# Patient Record
Sex: Female | Born: 1990 | Race: Black or African American | Hispanic: Yes | Marital: Married | Smoking: Never smoker
Health system: Southern US, Community
[De-identification: ages and names within clinical notes are randomized; demographics above are authoritative.]

## PROBLEM LIST (undated history)

## (undated) ENCOUNTER — Inpatient Hospital Stay (HOSPITAL_COMMUNITY): Payer: Self-pay

## (undated) DIAGNOSIS — Z789 Other specified health status: Secondary | ICD-10-CM

## (undated) DIAGNOSIS — R51 Headache: Secondary | ICD-10-CM

## (undated) DIAGNOSIS — R519 Headache, unspecified: Secondary | ICD-10-CM

## (undated) DIAGNOSIS — G8929 Other chronic pain: Secondary | ICD-10-CM

## (undated) DIAGNOSIS — G4700 Insomnia, unspecified: Secondary | ICD-10-CM

## (undated) DIAGNOSIS — J3089 Other allergic rhinitis: Secondary | ICD-10-CM

## (undated) HISTORY — DX: Other allergic rhinitis: J30.89

## (undated) HISTORY — DX: Insomnia, unspecified: G47.00

## (undated) HISTORY — PX: NO PAST SURGERIES: SHX2092

## (undated) HISTORY — DX: Headache, unspecified: R51.9

## (undated) HISTORY — DX: Headache: R51

## (undated) HISTORY — DX: Other chronic pain: G89.29

---

## 2016-11-28 LAB — OB RESULTS CONSOLE HGB/HCT, BLOOD
HCT: 38
HEMOGLOBIN: 12.2

## 2016-11-28 LAB — OB RESULTS CONSOLE PLATELET COUNT: Platelets: 183

## 2017-02-27 ENCOUNTER — Encounter: Payer: Self-pay | Admitting: Advanced Practice Midwife

## 2017-02-27 ENCOUNTER — Ambulatory Visit (INDEPENDENT_AMBULATORY_CARE_PROVIDER_SITE_OTHER): Payer: Medicaid Other | Admitting: Advanced Practice Midwife

## 2017-02-27 ENCOUNTER — Other Ambulatory Visit (HOSPITAL_COMMUNITY)
Admission: RE | Admit: 2017-02-27 | Discharge: 2017-02-27 | Disposition: A | Discharge status: Home / Self Care | Payer: Medicaid Other | Source: Ambulatory Visit | Attending: Advanced Practice Midwife | Admitting: Advanced Practice Midwife

## 2017-02-27 VITALS — BP 105/64 | HR 82 | Ht 69.0 in | Wt 160.0 lb

## 2017-02-27 DIAGNOSIS — N898 Other specified noninflammatory disorders of vagina: Secondary | ICD-10-CM | POA: Diagnosis not present

## 2017-02-27 DIAGNOSIS — Z3689 Encounter for other specified antenatal screening: Secondary | ICD-10-CM | POA: Diagnosis not present

## 2017-02-27 DIAGNOSIS — B9689 Other specified bacterial agents as the cause of diseases classified elsewhere: Secondary | ICD-10-CM | POA: Insufficient documentation

## 2017-02-27 DIAGNOSIS — N760 Acute vaginitis: Secondary | ICD-10-CM | POA: Diagnosis not present

## 2017-02-27 DIAGNOSIS — Z3483 Encounter for supervision of other normal pregnancy, third trimester: Secondary | ICD-10-CM

## 2017-02-27 DIAGNOSIS — Z113 Encounter for screening for infections with a predominantly sexual mode of transmission: Secondary | ICD-10-CM

## 2017-02-27 DIAGNOSIS — Z3480 Encounter for supervision of other normal pregnancy, unspecified trimester: Secondary | ICD-10-CM | POA: Insufficient documentation

## 2017-02-27 LAB — POCT URINALYSIS DIP (DEVICE)
Bilirubin Urine: NEGATIVE
GLUCOSE, UA: NEGATIVE mg/dL
Hgb urine dipstick: NEGATIVE
Ketones, ur: NEGATIVE mg/dL
Nitrite: NEGATIVE
PROTEIN: NEGATIVE mg/dL
Specific Gravity, Urine: 1.01 (ref 1.005–1.030)
UROBILINOGEN UA: 0.2 mg/dL (ref 0.0–1.0)
pH: 7 (ref 5.0–8.0)

## 2017-02-27 NOTE — Progress Notes (Signed)
PMH form completed by the patient.

## 2017-02-27 NOTE — Patient Instructions (Signed)
Third Trimester of Pregnancy The third trimester is from week 28 through week 40 (months 7 through 9). The third trimester is a time when the unborn baby (fetus) is growing rapidly. At the end of the ninth month, the fetus is about 20 inches in length and weighs 6-10 pounds. Body changes during your third trimester Your body will continue to go through many changes during pregnancy. The changes vary from woman to woman. During the third trimester:  Your weight will continue to increase. You can expect to gain 25-35 pounds (11-16 kg) by the end of the pregnancy.  You may begin to get stretch marks on your hips, abdomen, and breasts.  You may urinate more often because the fetus is moving lower into your pelvis and pressing on your bladder.  You may develop or continue to have heartburn. This is caused by increased hormones that slow down muscles in the digestive tract.  You may develop or continue to have constipation because increased hormones slow digestion and cause the muscles that push waste through your intestines to relax.  You may develop hemorrhoids. These are swollen veins (varicose veins) in the rectum that can itch or be painful.  You may develop swollen, bulging veins (varicose veins) in your legs.  You may have increased body aches in the pelvis, back, or thighs. This is due to weight gain and increased hormones that are relaxing your joints.  You may have changes in your hair. These can include thickening of your hair, rapid growth, and changes in texture. Some women also have hair loss during or after pregnancy, or hair that feels dry or thin. Your hair will most likely return to normal after your baby is born.  Your breasts will continue to grow and they will continue to become tender. A yellow fluid (colostrum) may leak from your breasts. This is the first milk you are producing for your baby.  Your belly button may stick out.  You may notice more swelling in your hands,  face, or ankles.  You may have increased tingling or numbness in your hands, arms, and legs. The skin on your belly may also feel numb.  You may feel short of breath because of your expanding uterus.  You may have more problems sleeping. This can be caused by the size of your belly, increased need to urinate, and an increase in your body's metabolism.  You may notice the fetus "dropping," or moving lower in your abdomen (lightening).  You may have increased vaginal discharge.  You may notice your joints feel loose and you may have pain around your pelvic bone.  What to expect at prenatal visits You will have prenatal exams every 2 weeks until week 36. Then you will have weekly prenatal exams. During a routine prenatal visit:  You will be weighed to make sure you and the baby are growing normally.  Your blood pressure will be taken.  Your abdomen will be measured to track your baby's growth.  The fetal heartbeat will be listened to.  Any test results from the previous visit will be discussed.  You may have a cervical check near your due date to see if your cervix has softened or thinned (effaced).  You will be tested for Group B streptococcus. This happens between 35 and 37 weeks.  Your health care provider may ask you:  What your birth plan is.  How you are feeling.  If you are feeling the baby move.  If you have had   any abnormal symptoms, such as leaking fluid, bleeding, severe headaches, or abdominal cramping.  If you are using any tobacco products, including cigarettes, chewing tobacco, and electronic cigarettes.  If you have any questions.  Other tests or screenings that may be performed during your third trimester include:  Blood tests that check for low iron levels (anemia).  Fetal testing to check the health, activity level, and growth of the fetus. Testing is done if you have certain medical conditions or if there are problems during the  pregnancy.  Nonstress test (NST). This test checks the health of your baby to make sure there are no signs of problems, such as the baby not getting enough oxygen. During this test, a belt is placed around your belly. The baby is made to move, and its heart rate is monitored during movement.  What is false labor? False labor is a condition in which you feel small, irregular tightenings of the muscles in the womb (contractions) that usually go away with rest, changing position, or drinking water. These are called Braxton Hicks contractions. Contractions may last for hours, days, or even weeks before true labor sets in. If contractions come at regular intervals, become more frequent, increase in intensity, or become painful, you should see your health care provider. What are the signs of labor?  Abdominal cramps.  Regular contractions that start at 10 minutes apart and become stronger and more frequent with time.  Contractions that start on the top of the uterus and spread down to the lower abdomen and back.  Increased pelvic pressure and dull back pain.  A watery or bloody mucus discharge that comes from the vagina.  Leaking of amniotic fluid. This is also known as your "water breaking." It could be a slow trickle or a gush. Let your health care provider know if it has a color or strange odor. If you have any of these signs, call your health care provider right away, even if it is before your due date. Follow these instructions at home: Medicines  Follow your health care provider's instructions regarding medicine use. Specific medicines may be either safe or unsafe to take during pregnancy.  Take a prenatal vitamin that contains at least 600 micrograms (mcg) of folic acid.  If you develop constipation, try taking a stool softener if your health care provider approves. Eating and drinking  Eat a balanced diet that includes fresh fruits and vegetables, whole grains, good sources of protein  such as meat, eggs, or tofu, and low-fat dairy. Your health care provider will help you determine the amount of weight gain that is right for you.  Avoid raw meat and uncooked cheese. These carry germs that can cause birth defects in the baby.  If you have low calcium intake from food, talk to your health care provider about whether you should take a daily calcium supplement.  Eat four or five small meals rather than three large meals a day.  Limit foods that are high in fat and processed sugars, such as fried and sweet foods.  To prevent constipation: ? Drink enough fluid to keep your urine clear or pale yellow. ? Eat foods that are high in fiber, such as fresh fruits and vegetables, whole grains, and beans. Activity  Exercise only as directed by your health care provider. Most women can continue their usual exercise routine during pregnancy. Try to exercise for 30 minutes at least 5 days a week. Stop exercising if you experience uterine contractions.  Avoid heavy   lifting.  Do not exercise in extreme heat or humidity, or at high altitudes.  Wear low-heel, comfortable shoes.  Practice good posture.  You may continue to have sex unless your health care provider tells you otherwise. Relieving pain and discomfort  Take frequent breaks and rest with your legs elevated if you have leg cramps or low back pain.  Take warm sitz baths to soothe any pain or discomfort caused by hemorrhoids. Use hemorrhoid cream if your health care provider approves.  Wear a good support bra to prevent discomfort from breast tenderness.  If you develop varicose veins: ? Wear support pantyhose or compression stockings as told by your healthcare provider. ? Elevate your feet for 15 minutes, 3-4 times a day. Prenatal care  Write down your questions. Take them to your prenatal visits.  Keep all your prenatal visits as told by your health care provider. This is important. Safety  Wear your seat belt at  all times when driving.  Make a list of emergency phone numbers, including numbers for family, friends, the hospital, and police and fire departments. General instructions  Avoid cat litter boxes and soil used by cats. These carry germs that can cause birth defects in the baby. If you have a cat, ask someone to clean the litter box for you.  Do not travel far distances unless it is absolutely necessary and only with the approval of your health care provider.  Do not use hot tubs, steam rooms, or saunas.  Do not drink alcohol.  Do not use any products that contain nicotine or tobacco, such as cigarettes and e-cigarettes. If you need help quitting, ask your health care provider.  Do not use any medicinal herbs or unprescribed drugs. These chemicals affect the formation and growth of the baby.  Do not douche or use tampons or scented sanitary pads.  Do not cross your legs for long periods of time.  To prepare for the arrival of your baby: ? Take prenatal classes to understand, practice, and ask questions about labor and delivery. ? Make a trial run to the hospital. ? Visit the hospital and tour the maternity area. ? Arrange for maternity or paternity leave through employers. ? Arrange for family and friends to take care of pets while you are in the hospital. ? Purchase a rear-facing car seat and make sure you know how to install it in your car. ? Pack your hospital bag. ? Prepare the baby's nursery. Make sure to remove all pillows and stuffed animals from the baby's crib to prevent suffocation.  Visit your dentist if you have not gone during your pregnancy. Use a soft toothbrush to brush your teeth and be gentle when you floss. Contact a health care provider if:  You are unsure if you are in labor or if your water has broken.  You become dizzy.  You have mild pelvic cramps, pelvic pressure, or nagging pain in your abdominal area.  You have lower back pain.  You have persistent  nausea, vomiting, or diarrhea.  You have an unusual or bad smelling vaginal discharge.  You have pain when you urinate. Get help right away if:  Your water breaks before 37 weeks.  You have regular contractions less than 5 minutes apart before 37 weeks.  You have a fever.  You are leaking fluid from your vagina.  You have spotting or bleeding from your vagina.  You have severe abdominal pain or cramping.  You have rapid weight loss or weight gain.    You have shortness of breath with chest pain.  You notice sudden or extreme swelling of your face, hands, ankles, feet, or legs.  Your baby makes fewer than 10 movements in 2 hours.  You have severe headaches that do not go away when you take medicine.  You have vision changes. Summary  The third trimester is from week 28 through week 40, months 7 through 9. The third trimester is a time when the unborn baby (fetus) is growing rapidly.  During the third trimester, your discomfort may increase as you and your baby continue to gain weight. You may have abdominal, leg, and back pain, sleeping problems, and an increased need to urinate.  During the third trimester your breasts will keep growing and they will continue to become tender. A yellow fluid (colostrum) may leak from your breasts. This is the first milk you are producing for your baby.  False labor is a condition in which you feel small, irregular tightenings of the muscles in the womb (contractions) that eventually go away. These are called Braxton Hicks contractions. Contractions may last for hours, days, or even weeks before true labor sets in.  Signs of labor can include: abdominal cramps; regular contractions that start at 10 minutes apart and become stronger and more frequent with time; watery or bloody mucus discharge that comes from the vagina; increased pelvic pressure and dull back pain; and leaking of amniotic fluid. This information is not intended to replace advice  given to you by your health care provider. Make sure you discuss any questions you have with your health care provider. Document Released: 05/10/2001 Document Revised: 10/22/2015 Document Reviewed: 07/17/2012 Elsevier Interactive Patient Education  2017 Elsevier Inc.  

## 2017-02-27 NOTE — Progress Notes (Signed)
  Subjective:    Anna [name] is being seen today for her first obstetrical visit.  This is a planned pregnancy. She is at [redacted]w[redacted]d gestation. Her obstetrical history is significant for none. Relationship with FOB: significant other, living together. Patient does intend to breast feed. Pregnancy history fully reviewed.  Patient reports vaginal irritation and Patient reports yeast infection sx, and used OTC treatment. She would like to make sure it has resloved. .  Review of Systems:   Review of Systems  Constitutional: Negative for chills and fever.  Gastrointestinal: Negative for abdominal pain.  Genitourinary: Positive for vaginal discharge. Negative for dysuria, frequency and vaginal bleeding.    Objective:     BP 105/64   Pulse 82   Ht  (1.753 m)   Wt 160 lb (72.6 kg)   LMP 08/18/2016 (Exact Date)   BMI 23.63 kg/m  Physical Exam  Nursing note and vitals reviewed. Constitutional: She is oriented to person, place, and time. She appears well-developed and well-nourished. No distress.  HENT:  Head: Normocephalic.  Cardiovascular: Normal rate.   Respiratory: Effort normal.  GI: Soft. There is no tenderness. There is no rebound.  Neurological: She is alert and oriented to person, place, and time.  Skin: Skin is warm and dry.  Psychiatric: She has a normal mood and affect.    Exam   FH 28cm FHT 120BPM Assessment:    Pregnancy: [id number] There are no active problems to display for this patient.      Plan:     Initial labs drawn. Prenatal vitamins. Problem list reviewed and updated. AFP3 discussed: too late at this time. Will review records to see if it was done. . Role of ultrasound in pregnancy discussed; fetal survey: Will review records and order if needed . Amniocentesis discussed: not indicated. Follow up in 2 weeks. 50% of 40 min visit spent on counseling and coordination of care.     Thressa Sheller 02/27/2017

## 2017-02-27 NOTE — Addendum Note (Signed)
Addended by: Lorelle Gibbs L on: 02/27/2017 02:22 PM   Modules accepted: Orders

## 2017-02-28 LAB — CERVICOVAGINAL ANCILLARY ONLY
BACTERIAL VAGINITIS: POSITIVE — AB
Candida vaginitis: NEGATIVE
Chlamydia: NEGATIVE
Neisseria Gonorrhea: NEGATIVE
TRICH (WINDOWPATH): NEGATIVE

## 2017-03-02 ENCOUNTER — Ambulatory Visit (HOSPITAL_COMMUNITY)
Admission: RE | Admit: 2017-03-02 | Discharge: 2017-03-02 | Disposition: A | Discharge status: Home / Self Care | Payer: Medicaid Other | Source: Ambulatory Visit | Attending: Advanced Practice Midwife | Admitting: Advanced Practice Midwife

## 2017-03-02 ENCOUNTER — Other Ambulatory Visit: Payer: Self-pay | Admitting: Advanced Practice Midwife

## 2017-03-02 DIAGNOSIS — Z3689 Encounter for other specified antenatal screening: Secondary | ICD-10-CM

## 2017-03-02 DIAGNOSIS — O0932 Supervision of pregnancy with insufficient antenatal care, second trimester: Secondary | ICD-10-CM

## 2017-03-02 DIAGNOSIS — Z3A25 25 weeks gestation of pregnancy: Secondary | ICD-10-CM

## 2017-03-02 DIAGNOSIS — O402XX0 Polyhydramnios, second trimester, not applicable or unspecified: Secondary | ICD-10-CM

## 2017-03-02 DIAGNOSIS — Z3492 Encounter for supervision of normal pregnancy, unspecified, second trimester: Secondary | ICD-10-CM

## 2017-03-02 DIAGNOSIS — Z3483 Encounter for supervision of other normal pregnancy, third trimester: Secondary | ICD-10-CM

## 2017-03-02 DIAGNOSIS — Z3687 Encounter for antenatal screening for uncertain dates: Secondary | ICD-10-CM | POA: Insufficient documentation

## 2017-03-03 ENCOUNTER — Encounter: Payer: Self-pay | Admitting: *Deleted

## 2017-03-06 ENCOUNTER — Other Ambulatory Visit: Payer: Medicaid Other

## 2017-03-06 DIAGNOSIS — Z3493 Encounter for supervision of normal pregnancy, unspecified, third trimester: Secondary | ICD-10-CM

## 2017-03-07 ENCOUNTER — Other Ambulatory Visit: Payer: Self-pay | Admitting: General Practice

## 2017-03-07 LAB — OBSTETRIC PANEL, INCLUDING HIV
ANTIBODY SCREEN: NEGATIVE
BASOS: 0 %
Basophils Absolute: 0 10*3/uL (ref 0.0–0.2)
EOS (ABSOLUTE): 0.2 10*3/uL (ref 0.0–0.4)
Eos: 2 %
HEMATOCRIT: 35.2 % (ref 34.0–46.6)
HEMOGLOBIN: 11.5 g/dL (ref 11.1–15.9)
HEP B S AG: NEGATIVE
HIV SCREEN 4TH GENERATION: NONREACTIVE
IMMATURE GRANS (ABS): 0 10*3/uL (ref 0.0–0.1)
Immature Granulocytes: 0 %
LYMPHS: 27 %
Lymphocytes Absolute: 1.9 10*3/uL (ref 0.7–3.1)
MCH: 31.5 pg (ref 26.6–33.0)
MCHC: 32.7 g/dL (ref 31.5–35.7)
MCV: 96 fL (ref 79–97)
MONOCYTES: 6 %
Monocytes Absolute: 0.4 10*3/uL (ref 0.1–0.9)
NEUTROS ABS: 4.7 10*3/uL (ref 1.4–7.0)
Neutrophils: 65 %
Platelets: 193 10*3/uL (ref 150–379)
RBC: 3.65 x10E6/uL — ABNORMAL LOW (ref 3.77–5.28)
RDW: 14.4 % (ref 12.3–15.4)
RPR: NONREACTIVE
RUBELLA: 1.3 {index} (ref >0.99)
Rh Factor: POSITIVE
WBC: 7.2 10*3/uL (ref 3.4–10.8)

## 2017-03-07 LAB — GLUCOSE TOLERANCE, 2 HOURS W/ 1HR
GLUCOSE, 1 HOUR: 116 mg/dL (ref 65–179)
GLUCOSE, 2 HOUR: 90 mg/dL (ref 65–152)
Glucose, Fasting: 72 mg/dL (ref 65–91)

## 2017-03-07 MED ORDER — METRONIDAZOLE 500 MG PO TABS: 500.0000 mg | ORAL_TABLET | Freq: Two times a day (BID) | ORAL | 0 refills | Status: DC

## 2017-03-07 NOTE — Telephone Encounter (Signed)
Patient has been notified of lab results and a prescription for Flagyl 500 mg BID x7 days has been sent to [facility] per patient preference, patient verbalized understanding

## 2017-03-09 DIAGNOSIS — Z3480 Encounter for supervision of other normal pregnancy, unspecified trimester: Secondary | ICD-10-CM

## 2017-03-13 ENCOUNTER — Ambulatory Visit (INDEPENDENT_AMBULATORY_CARE_PROVIDER_SITE_OTHER): Payer: Medicaid Other | Admitting: Advanced Practice Midwife

## 2017-03-13 DIAGNOSIS — Z3480 Encounter for supervision of other normal pregnancy, unspecified trimester: Secondary | ICD-10-CM

## 2017-03-13 NOTE — Patient Instructions (Signed)
AREA PEDIATRIC/FAMILY PRACTICE PHYSICIANS  [facility] [address], [address] [place], [place]  [address] Phone - [phone]   Fax - [phone]  [facility] [address] [place], [place] [address] Phone - [phone]   Fax - [phone]  [facility] [address] [place], [place]  [address] Phone - [phone]   Fax - [phone]  [facility] [address] [place], [place]  [address] Phone - [phone]   Fax - [phone]  [facility] [address] [place], [place]  [address] Phone - [phone]   Fax - [phone]  [facility] [address] [place], [place]  [address] Phone - [phone]   Fax - [phone]  [facility] [address] [place], [place]  [address] Phone - [phone]   Fax - [phone]  [facility] [address] [place], [place]  [address] Phone - [phone]   Fax - [phone]  [facility] [address] [place], [place]  [address] Phone - [phone]   Fax - [phone] [facility] [address] [place], [place]  [address] Phone - [phone]   Fax - [phone]  [facility] [address] [place], [place]  [address] Phone - [phone]   Fax - [phone]  [facility] [address] [place], [place]  [address] Phone - [phone]   Fax - [phone]  [facility] [address] [place], [place]  [address] Phone - [phone]   Fax - [phone]  [facility] [address] [place], [place]  [address] Phone - [phone]  [facility] [address] [employer] [place], [place]  [address] Phone - [phone]   Fax - [phone]  [facility] [address] [place], [place]  [address] Phone - [phone]   Fax - [phone]  [facility] [address] [place], [place]  [address] Phone - [phone]   Fax - [phone]  [facility] [address] [place], [place]  [address] Phone - [phone]   Fax - [phone]   [facility] [address] [place], [place]  [address] Phone - [phone]   Fax - [phone]  [facility] [address] [place], [place]  [address] Phone - [phone]   Fax - [phone]  [facility] [address] [place], [place]  [address] Phone - [phone]   Fax - [phone]  [facility] [address] [place], [place]  [address] Phone - [phone]   Fax - [phone]  [facility] [address] , [place]  [address] Phone - [phone]   Fax - [phone] [facility] [address] [place], [place]  [address] Phone - [phone]   Fax - [phone]  [facility] [address] [place], [place]  [address] Phone - [phone]   Fax - [phone]  [facility] [address] [place], [place]  [address] Phone - [phone]   Fax - [phone]  [facility] Charlene Flemming MD [address] [place]  [address] Phone [phone]  Fax [phone]   

## 2017-03-13 NOTE — Progress Notes (Signed)
   PRENATAL VISIT NOTE  Subjective:  Anna [name] is a 26 y.o. [phone] at [redacted]w[redacted]d being seen today for ongoing prenatal care.  She is currently monitored for the following issues for this low-risk pregnancy and has Supervision of other normal pregnancy, antepartum on her problem list.  Patient reports no complaints.  Contractions: Irritability. Vag. Bleeding: None.  Movement: Present. Denies leaking of fluid.   The following portions of the patient's history were reviewed and updated as appropriate: allergies, current medications, past family history, past medical history, past social history, past surgical history and problem list. Problem list updated.  Objective:   Vitals:   03/13/17 1254  BP: 108/60  Pulse: 88  Weight: 166 lb 9.6 oz (75.6 kg)    Fetal Status: Fetal Heart Rate (bpm): 144   Movement: Present     General:  Alert, oriented and cooperative. Patient is in no acute distress.  Skin: Skin is warm and dry. No rash noted.   Cardiovascular: Normal heart rate noted  Respiratory: Normal respiratory effort, no problems with respiration noted  Abdomen: Soft, gravid, appropriate for gestational age.  Pain/Pressure: Present     Pelvic: Cervical exam deferred        Extremities: Normal range of motion.  Edema: None  Mental Status:  Normal mood and affect. Normal behavior. Normal judgment and thought content.   Assessment and Plan:  Pregnancy: [id number] at [redacted]w[redacted]d  1. Supervision of other normal pregnancy, antepartum - Routine care -FU [place] per MFM in 4 weeks   Preterm labor symptoms and general obstetric precautions including but not limited to vaginal bleeding, contractions, leaking of fluid and fetal movement were reviewed in detail with the patient. Please refer to After Visit Summary for other counseling recommendations.  Return in about 2 weeks (around 03/27/2017).   Thressa Sheller, CNM

## 2017-03-17 ENCOUNTER — Encounter: Payer: Self-pay | Admitting: *Deleted

## 2017-03-27 ENCOUNTER — Ambulatory Visit (INDEPENDENT_AMBULATORY_CARE_PROVIDER_SITE_OTHER): Payer: Medicaid Other | Admitting: Advanced Practice Midwife

## 2017-03-27 VITALS — BP 102/65 | HR 84 | Wt 167.8 lb

## 2017-03-27 DIAGNOSIS — Z3483 Encounter for supervision of other normal pregnancy, third trimester: Secondary | ICD-10-CM | POA: Diagnosis not present

## 2017-03-27 DIAGNOSIS — Z3480 Encounter for supervision of other normal pregnancy, unspecified trimester: Secondary | ICD-10-CM

## 2017-03-27 DIAGNOSIS — O403XX0 Polyhydramnios, third trimester, not applicable or unspecified: Secondary | ICD-10-CM

## 2017-03-27 DIAGNOSIS — Z23 Encounter for immunization: Secondary | ICD-10-CM | POA: Diagnosis not present

## 2017-03-27 NOTE — Patient Instructions (Signed)
AREA PEDIATRIC/FAMILY PRACTICE PHYSICIANS  [facility] [address] [place], [place]  [address] Phone - [phone]   Fax - [phone]  [facility] [address] [place], [place] [address] Phone - [phone]   Fax - [phone]  [facility] [address] [place], [place]  [address] Phone - [phone]   Fax - [phone]  [facility] [address] [place], [place]  [address] Phone - [phone]   Fax - [phone]  [facility] [address] [place], [place]  [address] Phone - [phone]   Fax - [phone]  [facility] [address] [place], [place]  [address] Phone - [phone]   Fax - [phone]  [facility] [place] [address] [place], [place]  [address] Phone - [phone]   Fax - [phone]  [facility] [address] [place], [place]  [address] Phone - [phone]   Fax - [phone]  [facility] [address] [place], [place]  [address] Phone - [phone]   Fax - [phone] [facility] [address] [place], [place]  [address] Phone - [phone]   Fax - [phone]  [facility] [address] [place], [place]  [address] Phone - [phone]   Fax - [phone]  [facility] [address] [place], [place]  [address] Phone - [phone]   Fax - [phone]  [facility] [address] [place], [place]  [address] Phone - [phone]   Fax - [phone]  [facility] [address] [place], [place]  [address] Phone - [phone]  [facility] [address] [place], [place]  [address] Phone - [phone]   Fax - [phone]  [facility] [address] [place], [place]  [address] Phone - [phone]   Fax - [phone]  [facility] [address] [place], [place]  [address] Phone - [phone]   Fax - [phone]  [facility] [address] [place], [place]  [address] Phone - [phone]   Fax - [phone]   [facility] [address] [place], [place]  [address] Phone - [phone]   Fax - [phone]  [facility] [address] [place], [place]  [address] Phone - [phone]   Fax - [phone]  [facility] [address] , [place]  [address] Phone - [phone]   Fax - [phone]  [facility] [address] , [place]  [address] Phone - [phone]   Fax - [phone]  [facility] [address] , [place]  [address] Phone - [phone]   Fax - [phone] [facility] [address] [place], [place]  [address] Phone - [phone]   Fax - [phone]  [facility] [address] [place], [place]  [address] Phone - [phone]   Fax - [phone]   [facility] [address] [place], [place]  [address] Phone - [phone]   Fax - [phone]  [facility] Charlene Flemming MD [address] [place]  [address] Phone [phone]  Fax [phone]   

## 2017-03-27 NOTE — Progress Notes (Signed)
   PRENATAL VISIT NOTE   Subjective:  Anna [name] is a 26 y.o. [phone] at 6960w0d being seen today for ongoing prenatal care.  She is currently monitored for the following issues for this low-risk pregnancy and has Supervision of other normal pregnancy, antepartum and Polyhydramnios affecting pregnancy in third trimester on her problem list.  Patient reports no complaints.  Contractions: Irritability. Vag. Bleeding: None.  Movement: Present. Denies leaking of fluid.   The following portions of the patient's history were reviewed and updated as appropriate: allergies, current medications, past family history, past medical history, past social history, past surgical history and problem list. Problem list updated.  Objective:   Vitals:   03/27/17 1005  BP: 102/65  Pulse: 84  Weight: 167 lb 12.8 oz (76.1 kg)    Fetal Status: Fetal Heart Rate (bpm): 146   Movement: Present     FH 30 cm  General:  Alert, oriented and cooperative. Patient is in no acute distress.  Skin: Skin is warm and dry. No rash noted.   Cardiovascular: Normal heart rate noted  Respiratory: Normal respiratory effort, no problems with respiration noted  Abdomen: Soft, gravid, appropriate for gestational age.  Pain/Pressure: Present     Pelvic: Cervical exam deferred        Extremities: Normal range of motion.  Edema: None  Mental Status:  Normal mood and affect. Normal behavior. Normal judgment and thought content.   Assessment and Plan:  Pregnancy: [id number] at 4360w0d  1. Supervision of other normal pregnancy, antepartum - Routine care  - TDaP today  - Will call prior OB office for records as we have not received them.   2. Polyhydramnios affecting pregnancy in third trimester - FU US with MFM on 04/03/17   Preterm labor symptoms and general obstetric precautions including but not limited to vaginal bleeding, contractions, leaking of fluid and fetal movement were reviewed in detail with the patient. Please refer to  After Visit Summary for other counseling recommendations.  Return in about 2 weeks (around 04/10/2017).   Thressa ShellerHeather Kennis Wissmann, CNM

## 2017-03-27 NOTE — Progress Notes (Signed)
Pt stated lower abdominal having pain.

## 2017-03-28 ENCOUNTER — Encounter: Payer: Self-pay | Admitting: *Deleted

## 2017-04-03 ENCOUNTER — Ambulatory Visit (HOSPITAL_COMMUNITY)
Admission: RE | Admit: 2017-04-03 | Discharge: 2017-04-03 | Disposition: A | Discharge status: Home / Self Care | Payer: Medicaid Other | Source: Ambulatory Visit | Attending: Advanced Practice Midwife | Admitting: Advanced Practice Midwife

## 2017-04-03 ENCOUNTER — Encounter (HOSPITAL_COMMUNITY): Payer: Self-pay

## 2017-04-03 ENCOUNTER — Other Ambulatory Visit: Payer: Self-pay | Admitting: Advanced Practice Midwife

## 2017-04-03 DIAGNOSIS — O365930 Maternal care for other known or suspected poor fetal growth, third trimester, not applicable or unspecified: Secondary | ICD-10-CM | POA: Diagnosis not present

## 2017-04-03 DIAGNOSIS — Z3A30 30 weeks gestation of pregnancy: Secondary | ICD-10-CM | POA: Diagnosis not present

## 2017-04-03 DIAGNOSIS — O403XX0 Polyhydramnios, third trimester, not applicable or unspecified: Secondary | ICD-10-CM | POA: Diagnosis not present

## 2017-04-03 DIAGNOSIS — Z3480 Encounter for supervision of other normal pregnancy, unspecified trimester: Secondary | ICD-10-CM

## 2017-04-03 HISTORY — DX: Other specified health status: Z78.9

## 2017-04-03 NOTE — Addendum Note (Signed)
Encounter addended by: Lenise ArenaBazemore, Sumedh Shinsato, RDMS on: 04/03/2017 10:51 AM  Actions taken: Imaging Exam ended

## 2017-04-10 ENCOUNTER — Other Ambulatory Visit (HOSPITAL_COMMUNITY)
Admission: RE | Admit: 2017-04-10 | Discharge: 2017-04-10 | Disposition: A | Discharge status: Home / Self Care | Payer: Medicaid Other | Source: Ambulatory Visit | Attending: Advanced Practice Midwife | Admitting: Advanced Practice Midwife

## 2017-04-10 ENCOUNTER — Ambulatory Visit (INDEPENDENT_AMBULATORY_CARE_PROVIDER_SITE_OTHER): Payer: Medicaid Other | Admitting: Advanced Practice Midwife

## 2017-04-10 VITALS — BP 120/60 | HR 83 | Wt 173.0 lb

## 2017-04-10 DIAGNOSIS — O365930 Maternal care for other known or suspected poor fetal growth, third trimester, not applicable or unspecified: Secondary | ICD-10-CM

## 2017-04-10 DIAGNOSIS — Z3483 Encounter for supervision of other normal pregnancy, third trimester: Secondary | ICD-10-CM

## 2017-04-10 DIAGNOSIS — Z113 Encounter for screening for infections with a predominantly sexual mode of transmission: Secondary | ICD-10-CM

## 2017-04-10 DIAGNOSIS — O365990 Maternal care for other known or suspected poor fetal growth, unspecified trimester, not applicable or unspecified: Secondary | ICD-10-CM | POA: Insufficient documentation

## 2017-04-10 DIAGNOSIS — Z3480 Encounter for supervision of other normal pregnancy, unspecified trimester: Secondary | ICD-10-CM | POA: Diagnosis present

## 2017-04-10 DIAGNOSIS — O365932 Maternal care for other known or suspected poor fetal growth, third trimester, fetus 2: Secondary | ICD-10-CM | POA: Insufficient documentation

## 2017-04-10 NOTE — Progress Notes (Signed)
   PRENATAL VISIT NOTE  Subjective:  Anna [name] is a 26 y.o. [id number] at 475w0d being seen today for ongoing prenatal care.  She is currently monitored for the following issues for this low-risk pregnancy and has Supervision of other normal pregnancy, antepartum; Polyhydramnios affecting pregnancy in third trimester; and IUGR (intrauterine growth restriction) affecting care of mother on their problem list.  Patient reports no complaints.  Contractions: Irregular. Vag. Bleeding: None.  Movement: Present. Denies leaking of fluid. Every couple of hours will have some contractions. Go away with water and rest. She denies any VB or LOF. Reports normal fetal movement. She denies intercourse in the last 24 hours.   The following portions of the patient's history were reviewed and updated as appropriate: allergies, current medications, past family history, past medical history, past social history, past surgical history and problem list. Problem list updated.  Objective:   Vitals:   04/10/17 1009  BP: 120/60  Pulse: 83  Weight: 173 lb (78.5 kg)    Fetal Status: Fetal Heart Rate (bpm): 143 Fundal Height: 33 cm Movement: Present     General:  Alert, oriented and cooperative. Patient is in no acute distress.  Skin: Skin is warm and dry. No rash noted.   Cardiovascular: Normal heart rate noted  Respiratory: Normal respiratory effort, no problems with respiration noted  Abdomen: Soft, gravid, appropriate for gestational age.  Pain/Pressure: Present     Pelvic: Cervical exam deferred Dilation: Closed Effacement (%): Thick Station: Ballotable  Extremities: Normal range of motion.  Edema: None  Mental Status:  Normal mood and affect. Normal behavior. Normal judgment and thought content.   Assessment and Plan:  Pregnancy: [id number] at 865w0d  1. Supervision of other normal pregnancy, antepartum - US MFM OB FOLLOW UP; Future - Wet prep. Gc/ct sent   2. Poor fetal growth affecting management of mother  in third trimester, single or unspecified fetus - US MFM OB FOLLOW UP; Future  Preterm labor symptoms and general obstetric precautions including but not limited to vaginal bleeding, contractions, leaking of fluid and fetal movement were reviewed in detail with the patient. Please refer to After Visit Summary for other counseling recommendations.  Return in about 2 weeks (around 04/24/2017).   Thressa ShellerHeather Jonasia Coiner, CNM

## 2017-04-10 NOTE — Patient Instructions (Signed)
Third Trimester of Pregnancy The third trimester is from week 28 through week 40 (months 7 through 9). The third trimester is a time when the unborn baby (fetus) is growing rapidly. At the end of the ninth month, the fetus is about 20 inches in length and weighs 6-10 pounds. Body changes during your third trimester Your body will continue to go through many changes during pregnancy. The changes vary from woman to woman. During the third trimester:  Your weight will continue to increase. You can expect to gain 25-35 pounds (11-16 kg) by the end of the pregnancy.  You may begin to get stretch marks on your hips, abdomen, and breasts.  You may urinate more often because the fetus is moving lower into your pelvis and pressing on your bladder.  You may develop or continue to have heartburn. This is caused by increased hormones that slow down muscles in the digestive tract.  You may develop or continue to have constipation because increased hormones slow digestion and cause the muscles that push waste through your intestines to relax.  You may develop hemorrhoids. These are swollen veins (varicose veins) in the rectum that can itch or be painful.  You may develop swollen, bulging veins (varicose veins) in your legs.  You may have increased body aches in the pelvis, back, or thighs. This is due to weight gain and increased hormones that are relaxing your joints.  You may have changes in your hair. These can include thickening of your hair, rapid growth, and changes in texture. Some women also have hair loss during or after pregnancy, or hair that feels dry or thin. Your hair will most likely return to normal after your baby is born.  Your breasts will continue to grow and they will continue to become tender. A yellow fluid (colostrum) may leak from your breasts. This is the first milk you are producing for your baby.  Your belly button may stick out.  You may notice more swelling in your hands,  face, or ankles.  You may have increased tingling or numbness in your hands, arms, and legs. The skin on your belly may also feel numb.  You may feel short of breath because of your expanding uterus.  You may have more problems sleeping. This can be caused by the size of your belly, increased need to urinate, and an increase in your body's metabolism.  You may notice the fetus "dropping," or moving lower in your abdomen (lightening).  You may have increased vaginal discharge.  You may notice your joints feel loose and you may have pain around your pelvic bone.  What to expect at prenatal visits You will have prenatal exams every 2 weeks until week 36. Then you will have weekly prenatal exams. During a routine prenatal visit:  You will be weighed to make sure you and the baby are growing normally.  Your blood pressure will be taken.  Your abdomen will be measured to track your baby's growth.  The fetal heartbeat will be listened to.  Any test results from the previous visit will be discussed.  You may have a cervical check near your due date to see if your cervix has softened or thinned (effaced).  You will be tested for Group B streptococcus. This happens between 35 and 37 weeks.  Your health care provider may ask you:  What your birth plan is.  How you are feeling.  If you are feeling the baby move.  If you have had   any abnormal symptoms, such as leaking fluid, bleeding, severe headaches, or abdominal cramping.  If you are using any tobacco products, including cigarettes, chewing tobacco, and electronic cigarettes.  If you have any questions.  Other tests or screenings that may be performed during your third trimester include:  Blood tests that check for low iron levels (anemia).  Fetal testing to check the health, activity level, and growth of the fetus. Testing is done if you have certain medical conditions or if there are problems during the  pregnancy.  Nonstress test (NST). This test checks the health of your baby to make sure there are no signs of problems, such as the baby not getting enough oxygen. During this test, a belt is placed around your belly. The baby is made to move, and its heart rate is monitored during movement.  What is false labor? False labor is a condition in which you feel small, irregular tightenings of the muscles in the womb (contractions) that usually go away with rest, changing position, or drinking water. These are called Braxton Hicks contractions. Contractions may last for hours, days, or even weeks before true labor sets in. If contractions come at regular intervals, become more frequent, increase in intensity, or become painful, you should see your health care provider. What are the signs of labor?  Abdominal cramps.  Regular contractions that start at 10 minutes apart and become stronger and more frequent with time.  Contractions that start on the top of the uterus and spread down to the lower abdomen and back.  Increased pelvic pressure and dull back pain.  A watery or bloody mucus discharge that comes from the vagina.  Leaking of amniotic fluid. This is also known as your "water breaking." It could be a slow trickle or a gush. Let your health care provider know if it has a color or strange odor. If you have any of these signs, call your health care provider right away, even if it is before your due date. Follow these instructions at home: Medicines  Follow your health care provider's instructions regarding medicine use. Specific medicines may be either safe or unsafe to take during pregnancy.  Take a prenatal vitamin that contains at least 600 micrograms (mcg) of folic acid.  If you develop constipation, try taking a stool softener if your health care provider approves. Eating and drinking  Eat a balanced diet that includes fresh fruits and vegetables, whole grains, good sources of protein  such as meat, eggs, or tofu, and low-fat dairy. Your health care provider will help you determine the amount of weight gain that is right for you.  Avoid raw meat and uncooked cheese. These carry germs that can cause birth defects in the baby.  If you have low calcium intake from food, talk to your health care provider about whether you should take a daily calcium supplement.  Eat four or five small meals rather than three large meals a day.  Limit foods that are high in fat and processed sugars, such as fried and sweet foods.  To prevent constipation: ? Drink enough fluid to keep your urine clear or pale yellow. ? Eat foods that are high in fiber, such as fresh fruits and vegetables, whole grains, and beans. Activity  Exercise only as directed by your health care provider. Most women can continue their usual exercise routine during pregnancy. Try to exercise for 30 minutes at least 5 days a week. Stop exercising if you experience uterine contractions.  Avoid heavy   lifting.  Do not exercise in extreme heat or humidity, or at high altitudes.  Wear low-heel, comfortable shoes.  Practice good posture.  You may continue to have sex unless your health care provider tells you otherwise. Relieving pain and discomfort  Take frequent breaks and rest with your legs elevated if you have leg cramps or low back pain.  Take warm sitz baths to soothe any pain or discomfort caused by hemorrhoids. Use hemorrhoid cream if your health care provider approves.  Wear a good support bra to prevent discomfort from breast tenderness.  If you develop varicose veins: ? Wear support pantyhose or compression stockings as told by your healthcare provider. ? Elevate your feet for 15 minutes, 3-4 times a day. Prenatal care  Write down your questions. Take them to your prenatal visits.  Keep all your prenatal visits as told by your health care provider. This is important. Safety  Wear your seat belt at  all times when driving.  Make a list of emergency phone numbers, including numbers for family, friends, the hospital, and police and fire departments. General instructions  Avoid cat litter boxes and soil used by cats. These carry germs that can cause birth defects in the baby. If you have a cat, ask someone to clean the litter box for you.  Do not travel far distances unless it is absolutely necessary and only with the approval of your health care provider.  Do not use hot tubs, steam rooms, or saunas.  Do not drink alcohol.  Do not use any products that contain nicotine or tobacco, such as cigarettes and e-cigarettes. If you need help quitting, ask your health care provider.  Do not use any medicinal herbs or unprescribed drugs. These chemicals affect the formation and growth of the baby.  Do not douche or use tampons or scented sanitary pads.  Do not cross your legs for long periods of time.  To prepare for the arrival of your baby: ? Take prenatal classes to understand, practice, and ask questions about labor and delivery. ? Make a trial run to the hospital. ? Visit the hospital and tour the maternity area. ? Arrange for maternity or paternity leave through employers. ? Arrange for family and friends to take care of pets while you are in the hospital. ? Purchase a rear-facing car seat and make sure you know how to install it in your car. ? Pack your hospital bag. ? Prepare the baby's nursery. Make sure to remove all pillows and stuffed animals from the baby's crib to prevent suffocation.  Visit your dentist if you have not gone during your pregnancy. Use a soft toothbrush to brush your teeth and be gentle when you floss. Contact a health care provider if:  You are unsure if you are in labor or if your water has broken.  You become dizzy.  You have mild pelvic cramps, pelvic pressure, or nagging pain in your abdominal area.  You have lower back pain.  You have persistent  nausea, vomiting, or diarrhea.  You have an unusual or bad smelling vaginal discharge.  You have pain when you urinate. Get help right away if:  Your water breaks before 37 weeks.  You have regular contractions less than 5 minutes apart before 37 weeks.  You have a fever.  You are leaking fluid from your vagina.  You have spotting or bleeding from your vagina.  You have severe abdominal pain or cramping.  You have rapid weight loss or weight gain.    You have shortness of breath with chest pain.  You notice sudden or extreme swelling of your face, hands, ankles, feet, or legs.  Your baby makes fewer than 10 movements in 2 hours.  You have severe headaches that do not go away when you take medicine.  You have vision changes. Summary  The third trimester is from week 28 through week 40, months 7 through 9. The third trimester is a time when the unborn baby (fetus) is growing rapidly.  During the third trimester, your discomfort may increase as you and your baby continue to gain weight. You may have abdominal, leg, and back pain, sleeping problems, and an increased need to urinate.  During the third trimester your breasts will keep growing and they will continue to become tender. A yellow fluid (colostrum) may leak from your breasts. This is the first milk you are producing for your baby.  False labor is a condition in which you feel small, irregular tightenings of the muscles in the womb (contractions) that eventually go away. These are called Braxton Hicks contractions. Contractions may last for hours, days, or even weeks before true labor sets in.  Signs of labor can include: abdominal cramps; regular contractions that start at 10 minutes apart and become stronger and more frequent with time; watery or bloody mucus discharge that comes from the vagina; increased pelvic pressure and dull back pain; and leaking of amniotic fluid. This information is not intended to replace advice  given to you by your health care provider. Make sure you discuss any questions you have with your health care provider. Document Released: 05/10/2001 Document Revised: 10/22/2015 Document Reviewed: 07/17/2012 Elsevier Interactive Patient Education  2017 Elsevier Inc.  

## 2017-04-11 LAB — CERVICOVAGINAL ANCILLARY ONLY
Bacterial vaginitis: NEGATIVE
CANDIDA VAGINITIS: NEGATIVE
CHLAMYDIA, DNA PROBE: NEGATIVE
Neisseria Gonorrhea: NEGATIVE
TRICH (WINDOWPATH): NEGATIVE

## 2017-04-24 ENCOUNTER — Ambulatory Visit (INDEPENDENT_AMBULATORY_CARE_PROVIDER_SITE_OTHER): Payer: Medicaid Other | Admitting: Advanced Practice Midwife

## 2017-04-24 ENCOUNTER — Ambulatory Visit (HOSPITAL_COMMUNITY)
Admission: RE | Admit: 2017-04-24 | Discharge: 2017-04-24 | Disposition: A | Discharge status: Home / Self Care | Payer: Medicaid Other | Source: Ambulatory Visit | Attending: Advanced Practice Midwife | Admitting: Advanced Practice Midwife

## 2017-04-24 VITALS — BP 111/63 | HR 85 | Wt 176.4 lb

## 2017-04-24 DIAGNOSIS — Z362 Encounter for other antenatal screening follow-up: Secondary | ICD-10-CM | POA: Diagnosis not present

## 2017-04-24 DIAGNOSIS — Z3483 Encounter for supervision of other normal pregnancy, third trimester: Secondary | ICD-10-CM

## 2017-04-24 DIAGNOSIS — O321XX0 Maternal care for breech presentation, not applicable or unspecified: Secondary | ICD-10-CM | POA: Insufficient documentation

## 2017-04-24 DIAGNOSIS — Z3A33 33 weeks gestation of pregnancy: Secondary | ICD-10-CM | POA: Diagnosis not present

## 2017-04-24 DIAGNOSIS — O365930 Maternal care for other known or suspected poor fetal growth, third trimester, not applicable or unspecified: Secondary | ICD-10-CM | POA: Diagnosis present

## 2017-04-24 DIAGNOSIS — Z3480 Encounter for supervision of other normal pregnancy, unspecified trimester: Secondary | ICD-10-CM

## 2017-04-24 NOTE — Patient Instructions (Signed)
AREA PEDIATRIC/FAMILY PRACTICE PHYSICIANS  [facility] [address], [address] [place], [place]  [address] Phone - [phone]   Fax - [phone]  [facility] [address] [place], [place] [address] Phone - [phone]   Fax - [phone]  [facility] [address] [place], [place]  [address] Phone - [phone]   Fax - [phone]  [facility] [address] [place], [place]  [address] Phone - [phone]   Fax - [phone]  [facility] [address] [place], [place]  [address] Phone - [phone]   Fax - [phone]  [facility] [address] [place], [place]  [address] Phone - [phone]   Fax - [phone]  [facility] [address] [place], [place]  [address] Phone - [phone]   Fax - [phone]  [facility] [address] [place], [place]  [address] Phone - [phone]   Fax - [phone]  [facility] [address] [place], [place]  [address] Phone - [phone]   Fax - [phone] [facility] [address] [place], [place]  [address] Phone - [phone]   Fax - [phone]  [facility] [address] [place], [place]  [address] Phone - [phone]   Fax - [phone]  [facility] [address] [place], [place]  [address] Phone - [phone]   Fax - [phone]  [facility] [address] [place], [place]  [address] Phone - [phone]   Fax - [phone]  [facility] [address] [place], [place]  [address] Phone - [phone]  [facility] [address] [place], [place]  [address] Phone - [phone]   Fax - [phone]  [facility] [address] [place], [place]  [address] Phone - [phone]   Fax - [phone]  [facility] [address] [place], [place]  [address] Phone - [phone]   Fax - [phone]  [facility] [address] [place], [place]  [address] Phone - [phone]   Fax - [phone]   [facility] [address] [place], [place]  [address] Phone - [phone]   Fax - [phone]  [facility] [address] [place], [place]  [address] Phone - [phone]   Fax - [phone]  [facility] [address] [place], [place]  [address] Phone - [phone]   Fax - [phone]  [facility] [address] [place], [place]  [address] Phone - [phone]   Fax - [phone]  [facility] [address] , [place]  [address] Phone - [phone]   Fax - [phone] [facility] [address] [place], [place]  [address] Phone - [phone]   Fax - [phone]  [facility] [address] [place], [place]  [address] Phone - [phone]   Fax - [phone]   [facility] [address] ,   [address] Phone - [phone]   Fax - [phone]  [facility] Charlene Flemming MD [address] [place]  [address] Phone [phone]  Fax [phone]   

## 2017-04-24 NOTE — Progress Notes (Signed)
   PRENATAL VISIT NOTE  Subjective:  Anna [name] is a 26 y.o. [phone] at 172w0d being seen today for ongoing prenatal care.  She is currently monitored for the following issues for this low-risk pregnancy and has Supervision of other normal pregnancy, antepartum; Polyhydramnios affecting pregnancy in third trimester; and IUGR (intrauterine growth restriction) affecting care of mother on their problem list.  Patient reports no complaints.  Contractions: Irregular. Vag. Bleeding: None.  Movement: Present. Denies leaking of fluid.   Patient's mother here from [place] to help with kids until after birth.   The following portions of the patient's history were reviewed and updated as appropriate: allergies, current medications, past family history, past medical history, past social history, past surgical history and problem list. Problem list updated.  Objective:   Vitals:   04/24/17 1016  BP: 111/63  Pulse: 85  Weight: 176 lb 6.4 oz (80 kg)    Fetal Status: Fetal Heart Rate (bpm): u/s 142 Fundal Height: 33 cm Movement: Present     General:  Alert, oriented and cooperative. Patient is in no acute distress.  Skin: Skin is warm and dry. No rash noted.   Cardiovascular: Normal heart rate noted  Respiratory: Normal respiratory effort, no problems with respiration noted  Abdomen: Soft, gravid, appropriate for gestational age.  Pain/Pressure: Present     Pelvic: Cervical exam deferred        Extremities: Normal range of motion.  Edema: None  Mental Status:  Normal mood and affect. Normal behavior. Normal judgment and thought content.   Assessment and Plan:  Pregnancy: [id number] at 3072w0d  1. Supervision of other normal pregnancy, antepartum - Routine care.  - MFM US today, normal results.   Preterm labor symptoms and general obstetric precautions including but not limited to vaginal bleeding, contractions, leaking of fluid and fetal movement were reviewed in detail with the patient. Please refer  to After Visit Summary for other counseling recommendations.  Return in about 2 weeks (around 05/08/2017).   Thressa ShellerHeather Hogan, CNM

## 2017-05-08 ENCOUNTER — Encounter: Payer: Medicaid Other | Admitting: Advanced Practice Midwife

## 2017-05-12 ENCOUNTER — Encounter: Payer: Self-pay | Admitting: Advanced Practice Midwife

## 2017-05-12 ENCOUNTER — Inpatient Hospital Stay (HOSPITAL_COMMUNITY)
Admission: AD | Admit: 2017-05-12 | Discharge: 2017-05-12 | Disposition: A | Discharge status: Home / Self Care | Payer: Medicaid Other | Source: Ambulatory Visit | Attending: Family Medicine | Admitting: Family Medicine

## 2017-05-12 ENCOUNTER — Encounter (HOSPITAL_COMMUNITY): Payer: Self-pay | Admitting: *Deleted

## 2017-05-12 DIAGNOSIS — Z3A35 35 weeks gestation of pregnancy: Secondary | ICD-10-CM | POA: Diagnosis not present

## 2017-05-12 DIAGNOSIS — Z888 Allergy status to other drugs, medicaments and biological substances status: Secondary | ICD-10-CM | POA: Diagnosis not present

## 2017-05-12 DIAGNOSIS — Z79899 Other long term (current) drug therapy: Secondary | ICD-10-CM | POA: Insufficient documentation

## 2017-05-12 DIAGNOSIS — O26893 Other specified pregnancy related conditions, third trimester: Secondary | ICD-10-CM | POA: Diagnosis present

## 2017-05-12 DIAGNOSIS — Z3480 Encounter for supervision of other normal pregnancy, unspecified trimester: Secondary | ICD-10-CM

## 2017-05-12 DIAGNOSIS — O4703 False labor before 37 completed weeks of gestation, third trimester: Secondary | ICD-10-CM | POA: Diagnosis not present

## 2017-05-12 DIAGNOSIS — O403XX0 Polyhydramnios, third trimester, not applicable or unspecified: Secondary | ICD-10-CM

## 2017-05-12 LAB — URINALYSIS, ROUTINE W REFLEX MICROSCOPIC
Bilirubin Urine: NEGATIVE
GLUCOSE, UA: NEGATIVE mg/dL
Hgb urine dipstick: NEGATIVE
Ketones, ur: NEGATIVE mg/dL
Nitrite: NEGATIVE
PH: 6 (ref 5.0–8.0)
PROTEIN: NEGATIVE mg/dL
SPECIFIC GRAVITY, URINE: 1.003 — AB (ref 1.005–1.030)

## 2017-05-12 NOTE — MAU Note (Signed)
Pt reports cramping and cervical pain x 3 days. Also reports contractions off/on

## 2017-05-12 NOTE — Discharge Instructions (Signed)
Keep your appointment in the clinic on Monday. Try the exercises we discussed. Return if you have vaginal leaking, vaginal bleeding, regular contractions , or the baby is not moving.

## 2017-05-12 NOTE — MAU Provider Note (Signed)
History     CSN: [id number]  Arrival date and time: 05/12/17 1538   First Provider Initiated Contact with Patient 05/12/17 1647      Chief Complaint  Patient presents with  . Vaginal Pain  . Contractions   HPI Anna [name] 26 y.o. 4774w1d  Comes to MAU with lower abdominal "soreness" for the past couple of days.  Has difficulty lifting her legs when walking.  Feeling sharp stabbing pains in her vagina.  Gave 2 EDC dates so reviewed her dating and found early US so confirmed EDC based on early ultrasound sent from [place] in [place].    OB History    Gravida Para Term Preterm AB Living   4 2 2   1 2    SAB TAB Ectopic Multiple Live Births     1     2      Past Medical History:  Diagnosis Date  . Medical history non-contributory     Past Surgical History:  Procedure Laterality Date  . NO PAST SURGERIES      History reviewed. No pertinent family history.  Social History   Tobacco Use  . Smoking status: Never Smoker  . Smokeless tobacco: Never Used  Substance Use Topics  . Alcohol use: No  . Drug use: No    Allergies:  Allergies  Allergen Reactions  . Ciprofloxacin Hcl Anaphylaxis    Medications Prior to Admission  Medication Sig Dispense Refill Last Dose  . diphenhydrAMINE (BENADRYL) 25 MG tablet Take 25 mg by mouth at bedtime as needed for allergies.   05/11/2017 at Unknown time  . Prenatal Multivit-Min-Fe-FA (PRENATAL VITAMINS PO) Take 2 capsules by mouth daily.    05/12/2017 at Unknown time  . metroNIDAZOLE (FLAGYL) 500 MG tablet Take 1 tablet (500 mg total) by mouth 2 (two) times daily. (Patient not taking: Reported on 03/13/2017) 14 tablet 0 Completed Course at Unknown time    Review of Systems  Constitutional: Negative for fever.  Gastrointestinal: Negative for nausea and vomiting.       Lower abdominal soreness  Genitourinary: Negative for dysuria, vaginal bleeding and vaginal discharge.  No leaking of fluid.  Baby moving well.  Physical Exam    Blood pressure 108/62, pulse 84, temperature 98.6 F (37 C), temperature source Oral, resp. rate 16, height 5\' 9"  (1.753 m), weight 181 lb (82.1 kg), last menstrual period 08/18/2016, SpO2 100 %.  Physical Exam  Nursing note and vitals reviewed. Constitutional: She is oriented to person, place, and time. She appears well-developed and well-nourished.  HENT:  Head: Normocephalic.  Eyes: EOM are normal.  Neck: Neck supple.  GI: Soft. There is no tenderness.  FHT baseline is 130 with moderate variability.  Occasional contraction.  15x15 accels noted - reactive strip.  No decelerations.  Genitourinary:  Genitourinary Comments: Cervical exam - vertex presenting, cervix is very posterior, closed to FT likely - unable to completely reach to evaluate.  Musculoskeletal: Normal range of motion.  Pain at pubic symphysis with palpation. Having pain when transferring weight and lifting leg when walking.  Neurological: She is alert and oriented to person, place, and time.  Skin: Skin is warm and dry.  Psychiatric: She has a normal mood and affect.    MAU Course  Procedures Results for orders placed or performed during the hospital encounter of 05/12/17 (from the past 24 hour(s))  Urinalysis, Routine w reflex microscopic     Status: Abnormal   Collection Time: 05/12/17  3:56 PM  Result  Value Ref Range   Color, Urine YELLOW YELLOW   APPearance CLEAR CLEAR   Specific Gravity, Urine 1.003 (L) 1.005 - 1.030   pH 6.0 5.0 - 8.0   Glucose, UA NEGATIVE NEGATIVE mg/dL   Hgb urine dipstick NEGATIVE NEGATIVE   Bilirubin Urine NEGATIVE NEGATIVE   Ketones, ur NEGATIVE NEGATIVE mg/dL   Protein, ur NEGATIVE NEGATIVE mg/dL   Nitrite NEGATIVE NEGATIVE   Leukocytes, UA SMALL (A) NEGATIVE   RBC / HPF 0-5 0 - 5 RBC/hpf   WBC, UA 0-5 0 - 5 WBC/hpf   Bacteria, UA RARE (A) NONE SEEN   Squamous Epithelial / LPF 6-30 (A) NONE SEEN    MDM Based on cervical exam, client is not in labor.  Very few  contractions.  Exam is significant for pubic symphysis discomfort which is causing her difficulty in walking.  Advised to wear abdominal support belt consistently and showed her exercised to stretch out her lower back which may help the pubic symphysis discomfort.  Assessment and Plan  False labor at 3540w1d Pubic symphysis pain  Plan Demonstrated exercises to stretch lower back and assist with pain from pubic symphysis. Keep your appointment in the clinic. Return with regular contractions - baby needs to be born after 37 weeks. Return if having vaginal bleeding or leaking or the baby is not moving. Can use an ice pack for 15 minutes at a time on your back.  See instructions in AVS.  Wilhelm Ganaway L Skyler Dusing 05/12/2017, 5:11 PM

## 2017-05-15 ENCOUNTER — Encounter: Payer: Self-pay | Admitting: Medical

## 2017-05-15 ENCOUNTER — Other Ambulatory Visit (HOSPITAL_COMMUNITY)
Admission: RE | Admit: 2017-05-15 | Discharge: 2017-05-15 | Disposition: A | Discharge status: Home / Self Care | Payer: Medicaid Other | Source: Ambulatory Visit | Attending: Medical | Admitting: Medical

## 2017-05-15 ENCOUNTER — Ambulatory Visit (INDEPENDENT_AMBULATORY_CARE_PROVIDER_SITE_OTHER): Payer: Medicaid Other | Admitting: Medical

## 2017-05-15 VITALS — BP 113/62 | HR 93 | Wt 180.0 lb

## 2017-05-15 DIAGNOSIS — O365930 Maternal care for other known or suspected poor fetal growth, third trimester, not applicable or unspecified: Secondary | ICD-10-CM

## 2017-05-15 DIAGNOSIS — Z3480 Encounter for supervision of other normal pregnancy, unspecified trimester: Secondary | ICD-10-CM | POA: Diagnosis present

## 2017-05-15 DIAGNOSIS — O403XX0 Polyhydramnios, third trimester, not applicable or unspecified: Secondary | ICD-10-CM

## 2017-05-15 LAB — OB RESULTS CONSOLE GBS: GBS: NEGATIVE

## 2017-05-15 NOTE — Progress Notes (Signed)
   PRENATAL VISIT NOTE  Subjective:  Asa Lenteziza Lafavor is a 26 y.o. [phone] at 6069w4d being seen today for ongoing prenatal care.  She is currently monitored for the following issues for this low-risk pregnancy and has Supervision of other normal pregnancy, antepartum; Polyhydramnios affecting pregnancy in third trimester; and IUGR (intrauterine growth restriction) affecting care of mother on their problem list.  Patient reports no complaints.  Contractions: Irregular. Vag. Bleeding: None.  Movement: Present. Denies leaking of fluid.   The following portions of the patient's history were reviewed and updated as appropriate: allergies, current medications, past family history, past medical history, past social history, past surgical history and problem list. Problem list updated.  Objective:   Vitals:   05/15/17 0831  BP: 113/62  Pulse: 93  Weight: 180 lb (81.6 kg)    Fetal Status: Fetal Heart Rate (bpm): 131 Fundal Height: 35 cm Movement: Present     General:  Alert, oriented and cooperative. Patient is in no acute distress.  Skin: Skin is warm and dry. No rash noted.   Cardiovascular: Normal heart rate noted  Respiratory: Normal respiratory effort, no problems with respiration noted  Abdomen: Soft, gravid, appropriate for gestational age.  Pain/Pressure: Present     Pelvic: Cervical exam performed Dilation: 1 Effacement (%): Thick Station: -3  Extremities: Normal range of motion.  Edema: None  Mental Status:  Normal mood and affect. Normal behavior. Normal judgment and thought content.   Assessment and Plan:  Pregnancy: [id number] at 4569w4d  1. Supervision of other normal pregnancy, antepartum - Culture, beta strep (group b only) - Cervicovaginal ancillary only  2. Polyhydramnios affecting pregnancy in third trimester - resolved at last US, no further testing per MFM  3. Poor fetal growth affecting management of mother in third trimester, single or unspecified fetus - last EFW  44%  Preterm labor symptoms and general obstetric precautions including but not limited to vaginal bleeding, contractions, leaking of fluid and fetal movement were reviewed in detail with the patient. Please refer to After Visit Summary for other counseling recommendations.  Return in about 1 week (around 05/22/2017) for LOB.   Vonzella NippleJulie Wenzel, PA-C

## 2017-05-15 NOTE — Patient Instructions (Signed)
Fetal Movement Counts °Patient Name: ________________________________________________ Patient Due Date: ____________________ °What is a fetal movement count? °A fetal movement count is the number of times that you feel your baby move during a certain amount of time. This may also be called a fetal kick count. A fetal movement count is recommended for every pregnant woman. You may be asked to start counting fetal movements as early as week 28 of your pregnancy. °Pay attention to when your baby is most active. You may notice your baby's sleep and wake cycles. You may also notice things that make your baby move more. You should do a fetal movement count: °· When your baby is normally most active. °· At the same time each day. ° °A good time to count movements is while you are resting, after having something to eat and drink. °How do I count fetal movements? °1. Find a quiet, comfortable area. Sit, or lie down on your side. °2. Write down the date, the start time and stop time, and the number of movements that you felt between those two times. Take this information with you to your health care visits. °3. For 2 hours, count kicks, flutters, swishes, rolls, and jabs. You should feel at least 10 movements during 2 hours. °4. You may stop counting after you have felt 10 movements. °5. If you do not feel 10 movements in 2 hours, have something to eat and drink. Then, keep resting and counting for 1 hour. If you feel at least 4 movements during that hour, you may stop counting. °Contact a health care provider if: °· You feel fewer than 4 movements in 2 hours. °· Your baby is not moving like he or she usually does. °Date: ____________ Start time: ____________ Stop time: ____________ Movements: ____________ °Date: ____________ Start time: ____________ Stop time: ____________ Movements: ____________ °Date: ____________ Start time: ____________ Stop time: ____________ Movements: ____________ °Date: ____________ Start time:  ____________ Stop time: ____________ Movements: ____________ °Date: ____________ Start time: ____________ Stop time: ____________ Movements: ____________ °Date: ____________ Start time: ____________ Stop time: ____________ Movements: ____________ °Date: ____________ Start time: ____________ Stop time: ____________ Movements: ____________ °Date: ____________ Start time: ____________ Stop time: ____________ Movements: ____________ °Date: ____________ Start time: ____________ Stop time: ____________ Movements: ____________ °This information is not intended to replace advice given to you by your health care provider. Make sure you discuss any questions you have with your health care provider. °Document Released: 06/15/2006 Document Revised: 01/13/2016 Document Reviewed: 06/25/2015 °Elsevier Interactive Patient Education © 2018 Elsevier Inc. °Braxton Hicks Contractions °Contractions of the uterus can occur throughout pregnancy, but they are not always a sign that you are in labor. You may have practice contractions called Braxton Hicks contractions. These false labor contractions are sometimes confused with true labor. °What are Braxton Hicks contractions? °Braxton Hicks contractions are tightening movements that occur in the muscles of the uterus before labor. Unlike true labor contractions, these contractions do not result in opening (dilation) and thinning of the cervix. Toward the end of pregnancy (32-34 weeks), Braxton Hicks contractions can happen more often and may become stronger. These contractions are sometimes difficult to tell apart from true labor because they can be very uncomfortable. You should not feel embarrassed if you go to the hospital with false labor. °Sometimes, the only way to tell if you are in true labor is for your health care provider to look for changes in the cervix. The health care provider will do a physical exam and may monitor your contractions. If   you are not in true labor, the exam  should show that your cervix is not dilating and your water has not broken. °If there are no prenatal problems or other health problems associated with your pregnancy, it is completely safe for you to be sent home with false labor. You may continue to have Braxton Hicks contractions until you go into true labor. °How can I tell the difference between true labor and false labor? °· Differences °? False labor °? Contractions last 30-70 seconds.: Contractions are usually shorter and not as strong as true labor contractions. °? Contractions become very regular.: Contractions are usually irregular. °? Discomfort is usually felt in the top of the uterus, and it spreads to the lower abdomen and low back.: Contractions are often felt in the front of the lower abdomen and in the groin. °? Contractions do not go away with walking.: Contractions may go away when you walk around or change positions while lying down. °? Contractions usually become more intense and increase in frequency.: Contractions get weaker and are shorter-lasting as time goes on. °? The cervix dilates and gets thinner.: The cervix usually does not dilate or become thin. °Follow these instructions at home: °· Take over-the-counter and prescription medicines only as told by your health care provider. °· Keep up with your usual exercises and follow other instructions from your health care provider. °· Eat and drink lightly if you think you are going into labor. °· If Braxton Hicks contractions are making you uncomfortable: °? Change your position from lying down or resting to walking, or change from walking to resting. °? Sit and rest in a tub of warm water. °? Drink enough fluid to keep your urine clear or pale yellow. Dehydration may cause these contractions. °? Do slow and deep breathing several times an hour. °· Keep all follow-up prenatal visits as told by your health care provider. This is important. °Contact a health care provider if: °· You have a  fever. °· You have continuous pain in your abdomen. °Get help right away if: °· Your contractions become stronger, more regular, and closer together. °· You have fluid leaking or gushing from your vagina. °· You pass blood-tinged mucus (bloody show). °· You have bleeding from your vagina. °· You have low back pain that you never had before. °· You feel your baby’s head pushing down and causing pelvic pressure. °· Your baby is not moving inside you as much as it used to. °Summary °· Contractions that occur before labor are called Braxton Hicks contractions, false labor, or practice contractions. °· Braxton Hicks contractions are usually shorter, weaker, farther apart, and less regular than true labor contractions. True labor contractions usually become progressively stronger and regular and they become more frequent. °· Manage discomfort from Braxton Hicks contractions by changing position, resting in a warm bath, drinking plenty of water, or practicing deep breathing. °This information is not intended to replace advice given to you by your health care provider. Make sure you discuss any questions you have with your health care provider. °Document Released: 05/16/2005 Document Revised: 04/04/2016 Document Reviewed: 04/04/2016 °Elsevier Interactive Patient Education © 2017 Elsevier Inc. ° °

## 2017-05-16 LAB — CERVICOVAGINAL ANCILLARY ONLY
CHLAMYDIA, DNA PROBE: NEGATIVE
NEISSERIA GONORRHEA: NEGATIVE

## 2017-05-19 LAB — CULTURE, BETA STREP (GROUP B ONLY): Strep Gp B Culture: NEGATIVE

## 2017-05-24 ENCOUNTER — Ambulatory Visit (INDEPENDENT_AMBULATORY_CARE_PROVIDER_SITE_OTHER): Payer: Medicaid Other

## 2017-05-24 VITALS — BP 124/66 | HR 85 | Wt 181.0 lb

## 2017-05-24 DIAGNOSIS — Z3480 Encounter for supervision of other normal pregnancy, unspecified trimester: Secondary | ICD-10-CM

## 2017-05-24 DIAGNOSIS — Z3483 Encounter for supervision of other normal pregnancy, third trimester: Secondary | ICD-10-CM

## 2017-05-24 NOTE — Patient Instructions (Addendum)
Fetal Movement Counts Patient Name: ________________________________________________ Patient Due Date: ____________________ What is a fetal movement count? A fetal movement count is the number of times that you feel your baby move during a certain amount of time. This may also be called a fetal kick count. A fetal movement count is recommended for every pregnant woman. You may be asked to start counting fetal movements as early as week 28 of your pregnancy. Pay attention to when your baby is most active. You may notice your baby's sleep and wake cycles. You may also notice things that make your baby move more. You should do a fetal movement count:  When your baby is normally most active.  At the same time each day.  A good time to count movements is while you are resting, after having something to eat and drink. How do I count fetal movements? 1. Find a quiet, comfortable area. Sit, or lie down on your side. 2. Write down the date, the start time and stop time, and the number of movements that you felt between those two times. Take this information with you to your health care visits. 3. For 2 hours, count kicks, flutters, swishes, rolls, and jabs. You should feel at least 10 movements during 2 hours. 4. You may stop counting after you have felt 10 movements. 5. If you do not feel 10 movements in 2 hours, have something to eat and drink. Then, keep resting and counting for 1 hour. If you feel at least 4 movements during that hour, you may stop counting. Contact a health care provider if:  You feel fewer than 4 movements in 2 hours.  Your baby is not moving like he or she usually does. Date: ____________ Start time: ____________ Stop time: ____________ Movements: ____________ Date: ____________ Start time: ____________ Stop time: ____________ Movements: ____________ Date: ____________ Start time: ____________ Stop time: ____________ Movements: ____________ Date: ____________ Start time:  ____________ Stop time: ____________ Movements: ____________ Date: ____________ Start time: ____________ Stop time: ____________ Movements: ____________ Date: ____________ Start time: ____________ Stop time: ____________ Movements: ____________ Date: ____________ Start time: ____________ Stop time: ____________ Movements: ____________ Date: ____________ Start time: ____________ Stop time: ____________ Movements: ____________ Date: ____________ Start time: ____________ Stop time: ____________ Movements: ____________ This information is not intended to replace advice given to you by your health care provider. Make sure you discuss any questions you have with your health care provider. Document Released: 06/15/2006 Document Revised: 01/13/2016 Document Reviewed: 06/25/2015 Elsevier Interactive Patient Education  2018 [employer]. [employer] of the uterus can occur throughout pregnancy, but they are not always a sign that you are in labor. You may have practice contractions called Braxton Hicks contractions. These false labor contractions are sometimes confused with true labor. What are [place] contractions? Braxton Hicks contractions are tightening movements that occur in the muscles of the uterus before labor. Unlike true labor contractions, these contractions do not result in opening (dilation) and thinning of the cervix. Toward the end of pregnancy (32-34 weeks), Braxton Hicks contractions can happen more often and may become stronger. These contractions are sometimes difficult to tell apart from true labor because they can be very uncomfortable. You should not feel embarrassed if you go to the hospital with false labor. Sometimes, the only way to tell if you are in true labor is for your health care provider to look for changes in the cervix. The health care provider will do a physical exam and may monitor your contractions. If  you are not in true labor, the exam  should show that your cervix is not dilating and your water has not broken. If there are other health problems associated with your pregnancy, it is completely safe for you to be sent home with false labor. You may continue to have Braxton Hicks contractions until you go into true labor. How to tell the difference between true labor and false labor True labor  Contractions last 30-70 seconds.  Contractions become very regular.  Discomfort is usually felt in the top of the uterus, and it spreads to the lower abdomen and low back.  Contractions do not go away with walking.  Contractions usually become more intense and increase in frequency.  The cervix dilates and gets thinner. False labor  Contractions are usually shorter and not as strong as true labor contractions.  Contractions are usually irregular.  Contractions are often felt in the front of the lower abdomen and in the groin.  Contractions may go away when you walk around or change positions while lying down.  Contractions get weaker and are shorter-lasting as time goes on.  The cervix usually does not dilate or become thin. Follow these instructions at home:  Take over-the-counter and prescription medicines only as told by your health care provider.  Keep up with your usual exercises and follow other instructions from your health care provider.  Eat and drink lightly if you think you are going into labor.  If Braxton Hicks contractions are making you uncomfortable: ? Change your position from lying down or resting to walking, or change from walking to resting. ? Sit and rest in a tub of warm water. ? Drink enough fluid to keep your urine pale yellow. Dehydration may cause these contractions. ? Do slow and deep breathing several times an hour.  Keep all follow-up prenatal visits as told by your health care provider. This is important. Contact a health care provider if:  You have a fever.  You have continuous pain  in your abdomen. Get help right away if:  Your contractions become stronger, more regular, and closer together.  You have fluid leaking or gushing from your vagina.  You pass blood-tinged mucus (bloody show).  You have bleeding from your vagina.  You have low back pain that you never had before.  You feel your baby's head pushing down and causing pelvic pressure.  Your baby is not moving inside you as much as it used to. Summary  Contractions that occur before labor are called Braxton Hicks contractions, false labor, or practice contractions.  Braxton Hicks contractions are usually shorter, weaker, farther apart, and less regular than true labor contractions. True labor contractions usually become progressively stronger and regular and they become more frequent.  Manage discomfort from [facility] contractions by changing position, resting in a warm bath, drinking plenty of water, or practicing deep breathing. This information is not intended to replace advice given to you by your health care provider. Make sure you discuss any questions you have with your health care provider. Document Released: 09/29/2016 Document Revised: 09/29/2016 Document Reviewed: 09/29/2016 Elsevier Interactive Patient Education  2018 [employer]  AREA PEDIATRIC/FAMILY PRACTICE PHYSICIANS  [facility] [address] [place], [place]  [address] Phone - [phone]   Fax - [phone]  [facility] [address] [place], [place] [address] Phone - [phone]   Fax - [phone]  [facility] [address] [place], [place]  [address] Phone - [phone]   Fax - [phone]  [facility] [address] [place], [place]  [address] Phone - [phone]   Fax - [phone]  [facility] [address] [place], [place]  [address] Phone - [phone]   Fax - [phone]  [facility] [address] [place], [place]  [address] Phone - [phone]   Fax - [phone]  [facility] [place] [address] [place], [place]  [address] Phone - [phone]   Fax - [phone]  [facility] [address] [place], [address] [place], [place]  [address] Phone - [phone]   Fax - [phone]  [facility] AT [facility] [address] [place], [place]  [address] Phone - [phone]   Fax - [phone] [facility] AT [place] [address] [place], [place]  [address] Phone - [phone]   Fax - [phone]  [facility] [address] [place], [place]  [address] Phone - [phone]   Fax - [phone]  [facility] [address], [address] [place], [place]  [address] Phone - [phone]   Fax - [phone]  [facility] [place] [address] [place], [place]  [address] Phone - [phone]   Fax - [phone]  [facility] [address], [address] [place], [place]  [address] Phone - [phone]  [facility] [address] [place], [place]  [address] Phone - [phone]   Fax - [phone]  [facility] [address] [place], [place]  [address] Phone - [phone]   Fax - [phone]  [facility] [address] [place], [place]  [address] Phone - [phone]   Fax - [phone]  [facility] [address] [place], [place]  [address] Phone - [phone]   Fax - [phone]   [facility] [address] [place] [address] [place], [place]  [address] Phone - [phone]   Fax - [phone]  [facility] [address] [place], [place]  [address] Phone - [phone]   Fax - [phone]  DA[facility] RUBIN [address] [place], [place]  [address] Phone - [phone]   Fax -  [phone]  [facility] [address] [place], [place]  [address] Phone - [phone]   Fax - [phone]  [place] [facility] Alita Chyle [address] [place], [place]  [address] Phone - [phone]   Fax - [phone] Gerarda Fraction [address] [place], [place]  [address] Phone - [phone]   Fax - [phone]  [facility] [address] [place], [place]  [address] Phone - [phone]   Fax - [phone]  [facility] [place] [address] [place], [place]  [address] Phone - [phone]   Fax - [phone]  [facility] [place] Wyvonne Lenz MD [address] [place] [address] Phone [phone]  Fax [phone]

## 2017-05-24 NOTE — Progress Notes (Signed)
   PRENATAL VISIT NOTE  Subjective:  Anna [name] is a 26 y.o. [phone] at 4537w6d being seen today for ongoing prenatal care.  She is currently monitored for the following issues for this low-risk pregnancy and has Supervision of other normal pregnancy, antepartum; Polyhydramnios affecting pregnancy in third trimester; and IUGR (intrauterine growth restriction) affecting care of mother on their problem list.  Patient reports no complaints.  Contractions: Not present. Vag. Bleeding: None.  Movement: Present. Denies leaking of fluid.   The following portions of the patient's history were reviewed and updated as appropriate: allergies, current medications, past family history, past medical history, past social history, past surgical history and problem list. Problem list updated.  Objective:   Vitals:   05/24/17 0919  BP: 124/66  Pulse: 85  Weight: 181 lb (82.1 kg)    Fetal Status: Fetal Heart Rate (bpm): 137 Fundal Height: 37 cm Movement: Present     General:  Alert, oriented and cooperative. Patient is in no acute distress.  Skin: Skin is warm and dry. No rash noted.   Cardiovascular: Normal heart rate noted  Respiratory: Normal respiratory effort, no problems with respiration noted  Abdomen: Soft, gravid, appropriate for gestational age.  Pain/Pressure: Present     Pelvic: Cervical exam performed Dilation: 1 Effacement (%): Thick Station: -3  Extremities: Normal range of motion.  Edema: None  Mental Status:  Normal mood and affect. Normal behavior. Normal judgment and thought content.   Assessment and Plan:  Pregnancy: [id number] at 3337w6d  1. Supervision of other normal pregnancy, antepartum -Routine care -Support measures for end of pregnancy discomfort reviewed  Preterm labor symptoms and general obstetric precautions including but not limited to vaginal bleeding, contractions, leaking of fluid and fetal movement were reviewed in detail with the patient. Please refer to After Visit  Summary for other counseling recommendations.  Return in about 1 week (around 05/31/2017) for Return OB visit.  Rolm BookbinderCaroline M Pascual Mantel, CNM  05/24/17 9:33 AM

## 2017-05-30 NOTE — L&D Delivery Note (Signed)
Delivery Note At 8:00 PM a viable female was delivered via Vaginal, Spontaneous (Presentation: LOA ). ; weight pending patient skin to skin .   Placenta status: intact  , 3V Cord:  with the following complications: .none    Anesthesia: epidural   Episiotomy: None Lacerations: None Suture Repair: none  Est. Blood Loss (mL): 100  Mom to postpartum.  Baby to Couplet care / Skin to Skin. Mother and baby doing well   Everhett Bozard 06/15/2017, 8:21 PM

## 2017-05-30 NOTE — L&D Delivery Note (Signed)
Delivery Note At 8:00 PM a viable female was delivered via Vaginal, Spontaneous (Presentation: LOA;  ).  APGAR:8/9; weight pending. After 1 minute, the cord was clamped and cut. 40 units of pitocin diluted in 1000cc LR was infused rapidly IV.  The placenta separated spontaneously and delivered via CCT and maternal pushing effort.  It was inspected and appears to be intact with a 3 VC.   Anesthesia:  epidural Episiotomy: None Lacerations: None Suture Repair: n/a Est. Blood Loss (mL): 100  Mom to postpartum.  Baby to Couplet care / Skin to Skin.   Delivery by Dr. Nigel Bridgemanourtland Winborne under my direct supervision.  [name],Anna Harcum 06/15/2017, 8:20 PM  Please schedule this patient for PP visit in: 4 weeks Low risk pregnancy complicated by:  Delivery mode:  SVD Anticipated Birth Control:  POPs PP Procedures needed:   Schedule Integrated BH visit: no Provider: Any provider

## 2017-06-05 ENCOUNTER — Ambulatory Visit (INDEPENDENT_AMBULATORY_CARE_PROVIDER_SITE_OTHER): Payer: Medicaid Other | Admitting: Advanced Practice Midwife

## 2017-06-05 VITALS — BP 103/65 | HR 80 | Wt 184.9 lb

## 2017-06-05 DIAGNOSIS — Z3480 Encounter for supervision of other normal pregnancy, unspecified trimester: Secondary | ICD-10-CM

## 2017-06-05 DIAGNOSIS — Z3483 Encounter for supervision of other normal pregnancy, third trimester: Secondary | ICD-10-CM

## 2017-06-05 NOTE — Progress Notes (Signed)
   PRENATAL VISIT NOTE  Subjective:  Anna [name] is a 27 y.o. [phone] at 5960w4d being seen today for ongoing prenatal care.  She is currently monitored for the following issues for this low-risk pregnancy and has Supervision of other normal pregnancy, antepartum; Polyhydramnios affecting pregnancy in third trimester; and IUGR (intrauterine growth restriction) affecting care of mother on their problem list.  Patient reports contractions since 06/03/16 off and on, worse with walking/standing..  Contractions: Irregular. Vag. Bleeding: None.  Movement: Present. Denies leaking of fluid.   The following portions of the patient's history were reviewed and updated as appropriate: allergies, current medications, past family history, past medical history, past social history, past surgical history and problem list. Problem list updated.  Objective:   Vitals:   06/05/17 0950  BP: 103/65  Pulse: 80  Weight: 184 lb 14.4 oz (83.9 kg)    Fetal Status: Fetal Heart Rate (bpm): 133 Fundal Height: 39 cm Movement: Present     General:  Alert, oriented and cooperative. Patient is in no acute distress.  Skin: Skin is warm and dry. No rash noted.   Cardiovascular: Normal heart rate noted  Respiratory: Normal respiratory effort, no problems with respiration noted  Abdomen: Soft, gravid, appropriate for gestational age.  Pain/Pressure: Present     Pelvic: Cervical exam performed Dilation: 1 Effacement (%): 40 Station: -3  Extremities: Normal range of motion.  Edema: Trace  Mental Status:  Normal mood and affect. Normal behavior. Normal judgment and thought content.   Assessment and Plan:  Pregnancy: [id number] at 7060w4d  There are no diagnoses linked to this encounter. Term labor symptoms and general obstetric precautions including but not limited to vaginal bleeding, contractions, leaking of fluid and fetal movement were reviewed in detail with the patient. Please refer to After Visit Summary for other  counseling recommendations.  No Follow-up on file.   Thressa ShellerHeather Hogan, CNM

## 2017-06-12 ENCOUNTER — Ambulatory Visit (INDEPENDENT_AMBULATORY_CARE_PROVIDER_SITE_OTHER): Payer: Medicaid Other | Admitting: Obstetrics & Gynecology

## 2017-06-12 ENCOUNTER — Telehealth (HOSPITAL_COMMUNITY): Payer: Self-pay | Admitting: *Deleted

## 2017-06-12 ENCOUNTER — Encounter: Payer: Self-pay | Admitting: Obstetrics & Gynecology

## 2017-06-12 VITALS — BP 111/64 | HR 95 | Wt 186.7 lb

## 2017-06-12 DIAGNOSIS — Z3480 Encounter for supervision of other normal pregnancy, unspecified trimester: Secondary | ICD-10-CM

## 2017-06-12 DIAGNOSIS — Z3483 Encounter for supervision of other normal pregnancy, third trimester: Secondary | ICD-10-CM

## 2017-06-12 NOTE — Telephone Encounter (Signed)
Preadmission screen  

## 2017-06-12 NOTE — Progress Notes (Signed)
   PRENATAL VISIT NOTE  Subjective:  Anna [name] is a 27 y.o. [phone] at 7561w4d being seen today for ongoing prenatal care.  She is currently monitored for the following issues for this low-risk pregnancy and has Supervision of other normal pregnancy, antepartum on their problem list.  Patient reports occasional contractions.  Contractions: Irregular. Vag. Bleeding: None.  Movement: Present. Denies leaking of fluid.   The following portions of the patient's history were reviewed and updated as appropriate: allergies, current medications, past family history, past medical history, past social history, past surgical history and problem list. Problem list updated.  Objective:   Vitals:   06/12/17 1036  BP: 111/64  Pulse: 95  Weight: 84.7 kg (186 lb 11.2 oz)    Fetal Status: Fetal Heart Rate (bpm): 143   Movement: Present     General:  Alert, oriented and cooperative. Patient is in no acute distress.  Skin: Skin is warm and dry. No rash noted.   Cardiovascular: Normal heart rate noted  Respiratory: Normal respiratory effort, no problems with respiration noted  Abdomen: Soft, gravid, appropriate for gestational age.  Pain/Pressure: Present     Pelvic: Cervical exam performed        Extremities: Normal range of motion.  Edema: None  Mental Status:  Normal mood and affect. Normal behavior. Normal judgment and thought content.   Assessment and Plan:  Pregnancy: [id number] at 10161w4d  1. Supervision of other normal pregnancy, antepartum Doing well  Term labor symptoms and general obstetric precautions including but not limited to vaginal bleeding, contractions, leaking of fluid and fetal movement were reviewed in detail with the patient. Please refer to After Visit Summary for other counseling recommendations.  Return in about 1 week (around 06/19/2017).   Scheryl DarterJames Oron Westrup, MD

## 2017-06-12 NOTE — Patient Instructions (Signed)
Vaginal Delivery Vaginal delivery means that you will give birth by pushing your baby out of your birth canal (vagina). A team of health care providers will help you before, during, and after vaginal delivery. Birth experiences are unique for every woman and every pregnancy, and birth experiences vary depending on where you choose to give birth. What should I do to prepare for my baby's birth? Before your baby is born, it is important to talk with your health care provider about:  Your labor and delivery preferences. These may include: ? Medicines that you may be given. ? How you will manage your pain. This might include non-medical pain relief techniques or injectable pain relief such as epidural analgesia. ? How you and your baby will be monitored during labor and delivery. ? Who may be in the labor and delivery room with you. ? Your feelings about surgical delivery of your baby (cesarean delivery, or C-section) if this becomes necessary. ? Your feelings about receiving donated blood through an IV tube (blood transfusion) if this becomes necessary.  Whether you are able: ? To take pictures or videos of the birth. ? To eat during labor and delivery. ? To move around, walk, or change positions during labor and delivery.  What to expect after your baby is born, such as: ? Whether delayed umbilical cord clamping and cutting is offered. ? Who will care for your baby right after birth. ? Medicines or tests that may be recommended for your baby. ? Whether breastfeeding is supported in your hospital or birth center. ? How long you will be in the hospital or birth center.  How any medical conditions you have may affect your baby or your labor and delivery experience.  To prepare for your baby's birth, you should also:  Attend all of your health care visits before delivery (prenatal visits) as recommended by your health care provider. This is important.  Prepare your home for your baby's  arrival. Make sure that you have: ? Diapers. ? Baby clothing. ? Feeding equipment. ? Safe sleeping arrangements for you and your baby.  Install a car seat in your vehicle. Have your car seat checked by a certified car seat installer to make sure that it is installed safely.  Think about who will help you with your new baby at home for at least the first several weeks after delivery.  What can I expect when I arrive at the birth center or hospital? Once you are in labor and have been admitted into the hospital or birth center, your health care provider may:  Review your pregnancy history and any concerns you have.  Insert an IV tube into one of your veins. This is used to give you fluids and medicines.  Check your blood pressure, pulse, temperature, and heart rate (vital signs).  Check whether your bag of water (amniotic sac) has broken (ruptured).  Talk with you about your birth plan and discuss pain control options.  Monitoring Your health care provider may monitor your contractions (uterine monitoring) and your baby's heart rate (fetal monitoring). You may need to be monitored:  Often, but not continuously (intermittently).  All the time or for long periods at a time (continuously). Continuous monitoring may be needed if: ? You are taking certain medicines, such as medicine to relieve pain or make your contractions stronger. ? You have pregnancy or labor complications.  Monitoring may be done by:  Placing a special stethoscope or a handheld monitoring device on your abdomen to   check your baby's heartbeat, and feeling your abdomen for contractions. This method of monitoring does not continuously record your baby's heartbeat or your contractions.  Placing monitors on your abdomen (external monitors) to record your baby's heartbeat and the frequency and length of contractions. You may not have to wear external monitors all the time.  Placing monitors inside of your uterus  (internal monitors) to record your baby's heartbeat and the frequency, length, and strength of your contractions. ? Your health care provider may use internal monitors if he or she needs more information about the strength of your contractions or your baby's heart rate. ? Internal monitors are put in place by passing a thin, flexible wire through your vagina and into your uterus. Depending on the type of monitor, it may remain in your uterus or on your baby's head until birth. ? Your health care provider will discuss the benefits and risks of internal monitoring with you and will ask for your permission before inserting the monitors.  Telemetry. This is a type of continuous monitoring that can be done with external or internal monitors. Instead of having to stay in bed, you are able to move around during telemetry. Ask your health care provider if telemetry is an option for you.  Physical exam Your health care provider may perform a physical exam. This may include:  Checking whether your baby is positioned: ? With the head toward your vagina (head-down). This is most common. ? With the head toward the top of your uterus (head-up or breech). If your baby is in a breech position, your health care provider may try to turn your baby to a head-down position so you can deliver vaginally. If it does not seem that your baby can be born vaginally, your provider may recommend surgery to deliver your baby. In rare cases, you may be able to deliver vaginally if your baby is head-up (breech delivery). ? Lying sideways (transverse). Babies that are lying sideways cannot be delivered vaginally.  Checking your cervix to determine: ? Whether it is thinning out (effacing). ? Whether it is opening up (dilating). ? How low your baby has moved into your birth canal.  What are the three stages of labor and delivery?  Normal labor and delivery is divided into the following three stages: Stage 1  Stage 1 is the  longest stage of labor, and it can last for hours or days. Stage 1 includes: ? Early labor. This is when contractions may be irregular, or regular and mild. Generally, early labor contractions are more than 10 minutes apart. ? Active labor. This is when contractions get longer, more regular, more frequent, and more intense. ? The transition phase. This is when contractions happen very close together, are very intense, and may last longer than during any other part of labor.  Contractions generally feel mild, infrequent, and irregular at first. They get stronger, more frequent (about every 2-3 minutes), and more regular as you progress from early labor through active labor and transition.  Many women progress through stage 1 naturally, but you may need help to continue making progress. If this happens, your health care provider may talk with you about: ? Rupturing your amniotic sac if it has not ruptured yet. ? Giving you medicine to help make your contractions stronger and more frequent.  Stage 1 ends when your cervix is completely dilated to 4 inches (10 cm) and completely effaced. This happens at the end of the transition phase. Stage 2  Once   your cervix is completely effaced and dilated to 4 inches (10 cm), you may start to feel an urge to push. It is common for the body to naturally take a rest before feeling the urge to push, especially if you received an epidural or certain other pain medicines. This rest period may last for up to 1-2 hours, depending on your unique labor experience.  During stage 2, contractions are generally less painful, because pushing helps relieve contraction pain. Instead of contraction pain, you may feel stretching and burning pain, especially when the widest part of your baby's head passes through the vaginal opening (crowning).  Your health care provider will closely monitor your pushing progress and your baby's progress through the vagina during stage 2.  Your  health care provider may massage the area of skin between your vaginal opening and anus (perineum) or apply warm compresses to your perineum. This helps it stretch as the baby's head starts to crown, which can help prevent perineal tearing. ? In some cases, an incision may be made in your perineum (episiotomy) to allow the baby to pass through the vaginal opening. An episiotomy helps to make the opening of the vagina larger to allow more room for the baby to fit through.  It is very important to breathe and focus so your health care provider can control the delivery of your baby's head. Your health care provider may have you decrease the intensity of your pushing, to help prevent perineal tearing.  After delivery of your baby's head, the shoulders and the rest of the body generally deliver very quickly and without difficulty.  Once your baby is delivered, the umbilical cord may be cut right away, or this may be delayed for 1-2 minutes, depending on your baby's health. This may vary among health care providers, hospitals, and birth centers.  If you and your baby are healthy enough, your baby may be placed on your chest or abdomen to help maintain the baby's temperature and to help you bond with each other. Some mothers and babies start breastfeeding at this time. Your health care team will dry your baby and help keep your baby warm during this time.  Your baby may need immediate care if he or she: ? Showed signs of distress during labor. ? Has a medical condition. ? Was born too early (prematurely). ? Had a bowel movement before birth (meconium). ? Shows signs of difficulty transitioning from being inside the uterus to being outside of the uterus. If you are planning to breastfeed, your health care team will help you begin a feeding. Stage 3  The third stage of labor starts immediately after the birth of your baby and ends after you deliver the placenta. The placenta is an organ that develops  during pregnancy to provide oxygen and nutrients to your baby in the womb.  Delivering the placenta may require some pushing, and you may have mild contractions. Breastfeeding can stimulate contractions to help you deliver the placenta.  After the placenta is delivered, your uterus should tighten (contract) and become firm. This helps to stop bleeding in your uterus. To help your uterus contract and to control bleeding, your health care provider may: ? Give you medicine by injection, through an IV tube, by mouth, or through your rectum (rectally). ? Massage your abdomen or perform a vaginal exam to remove any blood clots that are left in your uterus. ? Empty your bladder by placing a thin, flexible tube (catheter) into your bladder. ? Encourage   you to breastfeed your baby. After labor is over, you and your baby will be monitored closely to ensure that you are both healthy until you are ready to go home. Your health care team will teach you how to care for yourself and your baby. This information is not intended to replace advice given to you by your health care provider. Make sure you discuss any questions you have with your health care provider. Document Released: 02/23/2008 Document Revised: 12/04/2015 Document Reviewed: 05/31/2015 Elsevier Interactive Patient Education  2018 [employer]  

## 2017-06-12 NOTE — Progress Notes (Signed)
Scheduled for IOL 06/22/17 0730.

## 2017-06-13 ENCOUNTER — Encounter: Payer: Self-pay | Admitting: *Deleted

## 2017-06-13 ENCOUNTER — Inpatient Hospital Stay (HOSPITAL_COMMUNITY)
Admission: AD | Admit: 2017-06-13 | Discharge: 2017-06-14 | Disposition: A | Discharge status: Home / Self Care | Payer: Medicaid Other | Source: Ambulatory Visit | Attending: Family Medicine | Admitting: Family Medicine

## 2017-06-13 DIAGNOSIS — Z3A39 39 weeks gestation of pregnancy: Secondary | ICD-10-CM | POA: Insufficient documentation

## 2017-06-13 DIAGNOSIS — O26893 Other specified pregnancy related conditions, third trimester: Secondary | ICD-10-CM | POA: Insufficient documentation

## 2017-06-13 DIAGNOSIS — O479 False labor, unspecified: Secondary | ICD-10-CM

## 2017-06-13 NOTE — MAU Note (Signed)
Pt states that she started having ctx's around 2030. She is experiencing intermittent pain 8/10 in her back radiating to her lower abdomen.

## 2017-06-13 NOTE — MAU Note (Signed)
Urine is in the Lab 

## 2017-06-14 ENCOUNTER — Encounter (HOSPITAL_COMMUNITY): Payer: Self-pay

## 2017-06-14 ENCOUNTER — Other Ambulatory Visit: Payer: Self-pay

## 2017-06-14 DIAGNOSIS — O479 False labor, unspecified: Secondary | ICD-10-CM

## 2017-06-14 DIAGNOSIS — Z3A39 39 weeks gestation of pregnancy: Secondary | ICD-10-CM | POA: Diagnosis not present

## 2017-06-14 DIAGNOSIS — O26893 Other specified pregnancy related conditions, third trimester: Secondary | ICD-10-CM | POA: Diagnosis not present

## 2017-06-14 LAB — POCT FERN TEST: POCT Fern Test: NEGATIVE

## 2017-06-14 NOTE — Discharge Instructions (Signed)
Braxton Hicks Contractions °Contractions of the uterus can occur throughout pregnancy, but they are not always a sign that you are in labor. You may have practice contractions called Braxton Hicks contractions. These false labor contractions are sometimes confused with true labor. °What are Braxton Hicks contractions? °Braxton Hicks contractions are tightening movements that occur in the muscles of the uterus before labor. Unlike true labor contractions, these contractions do not result in opening (dilation) and thinning of the cervix. Toward the end of pregnancy (32-34 weeks), Braxton Hicks contractions can happen more often and may become stronger. These contractions are sometimes difficult to tell apart from true labor because they can be very uncomfortable. You should not feel embarrassed if you go to the hospital with false labor. °Sometimes, the only way to tell if you are in true labor is for your health care provider to look for changes in the cervix. The health care provider will do a physical exam and may monitor your contractions. If you are not in true labor, the exam should show that your cervix is not dilating and your water has not broken. °If there are other health problems associated with your pregnancy, it is completely safe for you to be sent home with false labor. You may continue to have Braxton Hicks contractions until you go into true labor. °How to tell the difference between true labor and false labor °True labor °· Contractions last 30-70 seconds. °· Contractions become very regular. °· Discomfort is usually felt in the top of the uterus, and it spreads to the lower abdomen and low back. °· Contractions do not go away with walking. °· Contractions usually become more intense and increase in frequency. °· The cervix dilates and gets thinner. °False labor °· Contractions are usually shorter and not as strong as true labor contractions. °· Contractions are usually irregular. °· Contractions  are often felt in the front of the lower abdomen and in the groin. °· Contractions may go away when you walk around or change positions while lying down. °· Contractions get weaker and are shorter-lasting as time goes on. °· The cervix usually does not dilate or become thin. °Follow these instructions at home: °· Take over-the-counter and prescription medicines only as told by your health care provider. °· Keep up with your usual exercises and follow other instructions from your health care provider. °· Eat and drink lightly if you think you are going into labor. °· If Braxton Hicks contractions are making you uncomfortable: °? Change your position from lying down or resting to walking, or change from walking to resting. °? Sit and rest in a tub of warm water. °? Drink enough fluid to keep your urine pale yellow. Dehydration may cause these contractions. °? Do slow and deep breathing several times an hour. °· Keep all follow-up prenatal visits as told by your health care provider. This is important. °Contact a health care provider if: °· You have a fever. °· You have continuous pain in your abdomen. °Get help right away if: °· Your contractions become stronger, more regular, and closer together. °· You have fluid leaking or gushing from your vagina. °· You pass blood-tinged mucus (bloody show). °· You have bleeding from your vagina. °· You have low back pain that you never had before. °· You feel your baby’s head pushing down and causing pelvic pressure. °· Your baby is not moving inside you as much as it used to. °Summary °· Contractions that occur before labor are called Braxton   Hicks contractions, false labor, or practice contractions. °· Braxton Hicks contractions are usually shorter, weaker, farther apart, and less regular than true labor contractions. True labor contractions usually become progressively stronger and regular and they become more frequent. °· Manage discomfort from Braxton Hicks contractions by  changing position, resting in a warm bath, drinking plenty of water, or practicing deep breathing. °This information is not intended to replace advice given to you by your health care provider. Make sure you discuss any questions you have with your health care provider. °Document Released: 09/29/2016 Document Revised: 09/29/2016 Document Reviewed: 09/29/2016 °Elsevier Interactive Patient Education © 2018 [employer] ° °

## 2017-06-14 NOTE — MAU Note (Signed)
I have communicated with Dr. Loleta RoseLeland/Kim Booker and reviewed vital signs:  Vitals:   06/13/17 2349  BP: 114/73  Pulse: 91  Resp: 18  Temp: 98.4 F (36.9 C)  SpO2: 96%    Vaginal exam:  Dilation: 3 Effacement (%): 60 Cervical Position: Posterior Station: -3 Presentation: Vertex Exam by:: Zenia ResidesNikki Odyssey Vasbinder, RN,   Also reviewed contraction pattern and that non-stress test is reactive.  It has been documented that patient is contracting irregularly with no cervical change over 1-2 hours not indicating active labor.  Patient denies any other complaints.  Based on this report provider has given order for discharge.  A discharge order and diagnosis entered by a provider. Prior to discharge pt stated that she thought her water broke. The provider did a speculum exam to check for pooling of amniotic fluid and collected a firn slide, both were negative. Pt okay for discharge  Labor discharge instructions reviewed with patient.

## 2017-06-14 NOTE — MAU Provider Note (Signed)
Here for RN labor check, not seen by provider. No cervical change.  NST: FHR baseline 125 bpm, Variability: moderate, Accelerations:present, Decelerations:  Absent= Cat 1/Reactive Toco: irregular Anna MarkerKimberly R. Maxie [name], CNM, WHNP-BC 06/14/2017 2:03 AM

## 2017-06-15 ENCOUNTER — Other Ambulatory Visit: Payer: Self-pay

## 2017-06-15 ENCOUNTER — Encounter (HOSPITAL_COMMUNITY): Payer: Self-pay | Admitting: *Deleted

## 2017-06-15 ENCOUNTER — Inpatient Hospital Stay (HOSPITAL_COMMUNITY)
Admission: AD | Admit: 2017-06-15 | Discharge: 2017-06-17 | DRG: 807 | Disposition: A | Discharge status: Home / Self Care | Payer: Medicaid Other | Source: Ambulatory Visit | Attending: Obstetrics & Gynecology | Admitting: Obstetrics & Gynecology

## 2017-06-15 ENCOUNTER — Inpatient Hospital Stay (HOSPITAL_COMMUNITY): Payer: Medicaid Other | Admitting: Anesthesiology

## 2017-06-15 DIAGNOSIS — O4292 Full-term premature rupture of membranes, unspecified as to length of time between rupture and onset of labor: Secondary | ICD-10-CM | POA: Diagnosis present

## 2017-06-15 DIAGNOSIS — Z3A40 40 weeks gestation of pregnancy: Secondary | ICD-10-CM

## 2017-06-15 LAB — CBC
HCT: 32.7 % — ABNORMAL LOW (ref 36.0–46.0)
Hemoglobin: 11.2 g/dL — ABNORMAL LOW (ref 12.0–15.0)
MCH: 33 pg (ref 26.0–34.0)
MCHC: 34.3 g/dL (ref 30.0–36.0)
MCV: 96.5 fL (ref 78.0–100.0)
Platelets: 155 10*3/uL (ref 150–400)
RBC: 3.39 MIL/uL — ABNORMAL LOW (ref 3.87–5.11)
RDW: 14.3 % (ref 11.5–15.5)
WBC: 6.7 10*3/uL (ref 4.0–10.5)

## 2017-06-15 LAB — ABO/RH: ABO/RH(D): O POS

## 2017-06-15 LAB — POCT FERN TEST: POCT FERN TEST: POSITIVE

## 2017-06-15 LAB — TYPE AND SCREEN
ABO/RH(D): O POS
ANTIBODY SCREEN: NEGATIVE

## 2017-06-15 MED ORDER — DOCUSATE SODIUM 100 MG PO CAPS
100.0000 mg | ORAL_CAPSULE | Freq: Two times a day (BID) | ORAL | Status: DC
  Administered 2017-06-16 – 2017-06-17 (×3): 100 mg via ORAL
  Filled 2017-06-15 (×3): qty 1

## 2017-06-15 MED ORDER — SOD CITRATE-CITRIC ACID 500-334 MG/5ML PO SOLN: 30.0000 mL | ORAL | Status: DC | PRN

## 2017-06-15 MED ORDER — EPHEDRINE 5 MG/ML INJ
10.0000 mg | INTRAVENOUS | Status: DC | PRN
  Filled 2017-06-15: qty 2

## 2017-06-15 MED ORDER — METHYLERGONOVINE MALEATE 0.2 MG PO TABS: 0.2000 mg | ORAL_TABLET | ORAL | Status: DC | PRN

## 2017-06-15 MED ORDER — ZOLPIDEM TARTRATE 5 MG PO TABS: 5.0000 mg | ORAL_TABLET | Freq: Every evening | ORAL | Status: DC | PRN

## 2017-06-15 MED ORDER — ACETAMINOPHEN 325 MG PO TABS: 650.0000 mg | ORAL_TABLET | ORAL | Status: DC | PRN

## 2017-06-15 MED ORDER — FERROUS SULFATE 325 (65 FE) MG PO TABS
325.0000 mg | ORAL_TABLET | Freq: Two times a day (BID) | ORAL | Status: DC
  Administered 2017-06-16 – 2017-06-17 (×3): 325 mg via ORAL
  Filled 2017-06-15 (×3): qty 1

## 2017-06-15 MED ORDER — TETANUS-DIPHTH-ACELL PERTUSSIS 5-2.5-18.5 LF-MCG/0.5 IM SUSP: 0.5000 mL | Freq: Once | INTRAMUSCULAR | Status: DC

## 2017-06-15 MED ORDER — ONDANSETRON HCL 4 MG/2ML IJ SOLN: 4.0000 mg | Freq: Four times a day (QID) | INTRAMUSCULAR | Status: DC | PRN

## 2017-06-15 MED ORDER — EPHEDRINE 5 MG/ML INJ: 10.0000 mg | INTRAVENOUS | Status: DC | PRN

## 2017-06-15 MED ORDER — PHENYLEPHRINE 40 MCG/ML (10ML) SYRINGE FOR IV PUSH (FOR BLOOD PRESSURE SUPPORT): 80.0000 ug | PREFILLED_SYRINGE | INTRAVENOUS | Status: DC | PRN

## 2017-06-15 MED ORDER — OXYTOCIN 40 UNITS IN LACTATED RINGERS INFUSION - SIMPLE MED
1.0000 m[IU]/min | INTRAVENOUS | Status: DC
  Administered 2017-06-15: 2 m[IU]/min via INTRAVENOUS

## 2017-06-15 MED ORDER — LIDOCAINE HCL (PF) 1 % IJ SOLN
INTRAMUSCULAR | Status: DC | PRN
  Administered 2017-06-15 (×2): 5 mL via EPIDURAL

## 2017-06-15 MED ORDER — FLEET ENEMA 7-19 GM/118ML RE ENEM: 1.0000 | ENEMA | Freq: Every day | RECTAL | Status: DC | PRN

## 2017-06-15 MED ORDER — LACTATED RINGERS IV SOLN: 500.0000 mL | Freq: Once | INTRAVENOUS | Status: DC

## 2017-06-15 MED ORDER — PHENYLEPHRINE 40 MCG/ML (10ML) SYRINGE FOR IV PUSH (FOR BLOOD PRESSURE SUPPORT)
80.0000 ug | PREFILLED_SYRINGE | INTRAVENOUS | Status: DC | PRN
  Filled 2017-06-15: qty 5
  Filled 2017-06-15: qty 10

## 2017-06-15 MED ORDER — OXYTOCIN 40 UNITS IN LACTATED RINGERS INFUSION - SIMPLE MED
2.5000 [IU]/h | INTRAVENOUS | Status: DC
  Filled 2017-06-15: qty 1000

## 2017-06-15 MED ORDER — ONDANSETRON HCL 4 MG PO TABS: 4.0000 mg | ORAL_TABLET | ORAL | Status: DC | PRN

## 2017-06-15 MED ORDER — IBUPROFEN 600 MG PO TABS
600.0000 mg | ORAL_TABLET | Freq: Four times a day (QID) | ORAL | Status: DC
  Administered 2017-06-15 – 2017-06-17 (×7): 600 mg via ORAL
  Filled 2017-06-15 (×7): qty 1

## 2017-06-15 MED ORDER — METHYLERGONOVINE MALEATE 0.2 MG/ML IJ SOLN: 0.2000 mg | INTRAMUSCULAR | Status: DC | PRN

## 2017-06-15 MED ORDER — PRENATAL MULTIVITAMIN CH
1.0000 | ORAL_TABLET | Freq: Every day | ORAL | Status: DC
  Administered 2017-06-16 – 2017-06-17 (×2): 1 via ORAL
  Filled 2017-06-15 (×2): qty 1

## 2017-06-15 MED ORDER — DIBUCAINE 1 % RE OINT: 1.0000 "application " | TOPICAL_OINTMENT | RECTAL | Status: DC | PRN

## 2017-06-15 MED ORDER — TERBUTALINE SULFATE 1 MG/ML IJ SOLN
0.2500 mg | Freq: Once | INTRAMUSCULAR | Status: DC | PRN
  Filled 2017-06-15: qty 1

## 2017-06-15 MED ORDER — OXYTOCIN BOLUS FROM INFUSION
500.0000 mL | Freq: Once | INTRAVENOUS | Status: AC
  Administered 2017-06-15: 500 mL via INTRAVENOUS

## 2017-06-15 MED ORDER — BENZOCAINE-MENTHOL 20-0.5 % EX AERO: 1.0000 "application " | INHALATION_SPRAY | CUTANEOUS | Status: DC | PRN

## 2017-06-15 MED ORDER — DIPHENHYDRAMINE HCL 50 MG/ML IJ SOLN: 12.5000 mg | INTRAMUSCULAR | Status: DC | PRN

## 2017-06-15 MED ORDER — PHENYLEPHRINE 40 MCG/ML (10ML) SYRINGE FOR IV PUSH (FOR BLOOD PRESSURE SUPPORT)
80.0000 ug | PREFILLED_SYRINGE | INTRAVENOUS | Status: DC | PRN
  Filled 2017-06-15: qty 5

## 2017-06-15 MED ORDER — MEASLES, MUMPS & RUBELLA VAC ~~LOC~~ INJ: 0.5000 mL | INJECTION | Freq: Once | SUBCUTANEOUS | Status: DC

## 2017-06-15 MED ORDER — FENTANYL 2.5 MCG/ML BUPIVACAINE 1/10 % EPIDURAL INFUSION (WH - ANES)
14.0000 mL/h | INTRAMUSCULAR | Status: DC | PRN
  Administered 2017-06-15: 14 mL/h via EPIDURAL
  Filled 2017-06-15: qty 100

## 2017-06-15 MED ORDER — BISACODYL 10 MG RE SUPP: 10.0000 mg | Freq: Every day | RECTAL | Status: DC | PRN

## 2017-06-15 MED ORDER — OXYCODONE-ACETAMINOPHEN 5-325 MG PO TABS: 1.0000 | ORAL_TABLET | ORAL | Status: DC | PRN

## 2017-06-15 MED ORDER — WITCH HAZEL-GLYCERIN EX PADS: 1.0000 "application " | MEDICATED_PAD | CUTANEOUS | Status: DC | PRN

## 2017-06-15 MED ORDER — ONDANSETRON HCL 4 MG/2ML IJ SOLN: 4.0000 mg | INTRAMUSCULAR | Status: DC | PRN

## 2017-06-15 MED ORDER — LACTATED RINGERS IV SOLN
500.0000 mL | INTRAVENOUS | Status: DC | PRN
  Administered 2017-06-15: 500 mL via INTRAVENOUS

## 2017-06-15 MED ORDER — SIMETHICONE 80 MG PO CHEW: 80.0000 mg | CHEWABLE_TABLET | ORAL | Status: DC | PRN

## 2017-06-15 MED ORDER — OXYCODONE HCL 5 MG PO TABS
5.0000 mg | ORAL_TABLET | ORAL | Status: DC | PRN
  Administered 2017-06-16 – 2017-06-17 (×3): 5 mg via ORAL
  Filled 2017-06-15 (×3): qty 1

## 2017-06-15 MED ORDER — ACETAMINOPHEN 325 MG PO TABS
650.0000 mg | ORAL_TABLET | ORAL | Status: DC | PRN
  Administered 2017-06-16: 650 mg via ORAL
  Filled 2017-06-15: qty 2

## 2017-06-15 MED ORDER — OXYCODONE-ACETAMINOPHEN 5-325 MG PO TABS: 2.0000 | ORAL_TABLET | ORAL | Status: DC | PRN

## 2017-06-15 MED ORDER — COCONUT OIL OIL: 1.0000 "application " | TOPICAL_OIL | Status: DC | PRN

## 2017-06-15 MED ORDER — LACTATED RINGERS IV SOLN
INTRAVENOUS | Status: DC
  Administered 2017-06-15 (×2): 125 mL/h via INTRAVENOUS

## 2017-06-15 MED ORDER — DIPHENHYDRAMINE HCL 25 MG PO CAPS
25.0000 mg | ORAL_CAPSULE | Freq: Four times a day (QID) | ORAL | Status: DC | PRN
  Administered 2017-06-17: 25 mg via ORAL
  Filled 2017-06-15: qty 1

## 2017-06-15 MED ORDER — OXYCODONE HCL 5 MG PO TABS
10.0000 mg | ORAL_TABLET | ORAL | Status: DC | PRN
  Administered 2017-06-16: 10 mg via ORAL
  Filled 2017-06-15: qty 2

## 2017-06-15 MED ORDER — LIDOCAINE HCL (PF) 1 % IJ SOLN
30.0000 mL | INTRAMUSCULAR | Status: DC | PRN
  Filled 2017-06-15: qty 30

## 2017-06-15 NOTE — Progress Notes (Signed)
Anna [name] is a 27 y.o. [phone] at 3939w0d by ultrasound admitted for rupture of membranes at term   Subjective: Patient has been walking the floor for the last 30-40 min and states she was now feeling like she was having contractions. Patient was checked and 4/80/-1 posterior cervix.   Objective: BP (!) 110/59 (BP Location: Left Arm)   Pulse 89   Temp 98.3 F (36.8 C) (Oral)   Resp 16   Ht 5\' 9"  (1.753 m)   Wt 188 lb (85.3 kg)   LMP 08/18/2016 (Exact Date)   BMI 27.76 kg/m   FHT:  FHR: 130 bpm, variability: moderate,  accelerations:  Present,  decelerations:  Absent UC:   Patient just restarted on monitor will reassess  SVE:   Dilation: 4 Effacement (%): 80 Station: -1 Exam by:: MD Merlen Gurry  Labs: Lab Results  Component Value Date   WBC 6.7 06/15/2017   HGB 11.2 (L) 06/15/2017   HCT 32.7 (L) 06/15/2017   MCV 96.5 06/15/2017   PLT 155 06/15/2017    Assessment / Plan: Anna [name] is a 27 y.o. [id number] at 3939w0d by ultrasound admitted for rupture of membranes at term   Labor: 4/80/-1 patient now with contractions would like to go ahead and start pitocin and augment labor, will place patient on continuous monitor and begin pitocin Fetal Wellbeing:  Category I Pain Control:  can have epidual when needed  I/D:  GBS neg Anticipated MOD:  NSVD  Nichols Corter 06/15/2017, 12:54 PM

## 2017-06-15 NOTE — Progress Notes (Signed)
Anna [name] is a 27 y.o. [id number] at 4247w0d by ultrasound admitted for rupture of membranes  Subjective: Patient doing well. Contractions every 3 min has epidural in place and tolerating well. No urge to push or pressure.   Objective: BP 123/78   Pulse 86   Temp 98.3 F (36.8 C) (Oral)   Resp 16   Ht 5\' 9"  (1.753 m)   Wt 188 lb (85.3 kg)   LMP 08/18/2016 (Exact Date)   SpO2 99%   BMI 27.76 kg/m  No intake/output data recorded. No intake/output data recorded.  FHT:  FHR: 120 bpm, variability: moderate,  accelerations:  Present,  decelerations:  Absent UC:   regular, every 3 minutes SVE:   Dilation: 5.5 Effacement (%): 80 Station: -1 Exam by:: Anna HallJenny Middleton RN  Labs: Lab Results  Component Value Date   WBC 6.7 06/15/2017   HGB 11.2 (L) 06/15/2017   HCT 32.7 (L) 06/15/2017   MCV 96.5 06/15/2017   PLT 155 06/15/2017    Assessment / Plan: Augmentation of labor, progressing well  Labor: Progressing normally, pitocin at rate of 6 Fetal Wellbeing:  Category I Pain Control:  Epidural I/D:  n/a Anticipated MOD:  NSVD  Anna [name] 06/15/2017, 4:56 PM

## 2017-06-15 NOTE — MAU Note (Signed)
Bedside sono confirmed vertex presentation by Fabian NovemberM Bhambri CNM

## 2017-06-15 NOTE — H&P (Signed)
OBSTETRIC ADMISSION HISTORY AND PHYSICAL  Anna [name] is a 27 y.o. female [id number] with IUP at 4584w0d by LMP and 2nd trimester US   presenting for ROM. She reports +FMs, no VB, no blurry vision, headaches or peripheral edema, and RUQ pain.  She plans on breast  feeding. She request OCP for birth control. Patient was seen in MAU yesterday and was  3/60/-3 She [facility]   Dating: By LMP and second trimester US  --->  Estimated Date of Delivery: 06/15/17  Sono:  03/02/17  @[redacted]w[redacted]d , CWD, normal anatomy, cephalic presentation, posterior fundal placenta , 817g, 55% EFW  Clinic  [facility]  Prenatal Labs  Dating  LMP/2nd trimester US  Blood type: O/Positive/-- (10/08 1408)   Genetic Screen 1 Screen:    AFP:     Quad:     NIPS: Antibody:Negative (10/08 1408)  Anatomic US Mild poly on 03/06/17 Rubella: Immune   GTT Early:               Third trimester: 72/90/116 RPR: Non Reactive (10/08 1408)   Flu vaccine Declined 03/13/17 HBsAg: Negative (10/08 1408)   TDaP vaccine  03/27/17                                           Rhogam: NA HIV:   Non-reactive   Baby Food Breast                                            GBS: negative  Contraception  pill while breastfeeding Pap:  Circumcision Girl    Pediatrician  Has list  CF:  Support Person  Mom and FOB.  SMA  Prenatal Classes No Hgb electrophoresis:   Prenatal History/Complications:  Past Medical History: Past Medical History:  Diagnosis Date  . Medical history non-contributory     Past Surgical History: Past Surgical History:  Procedure Laterality Date  . NO PAST SURGERIES      Obstetrical History: OB History    Gravida Para Term Preterm AB Living   5 2 2   1 2    SAB TAB Ectopic Multiple Live Births     1     2      Social History: Social History   Socioeconomic History  . Marital status: Single    Spouse name: None  . Number of children: None  . Years of education: None  . Highest education level: None   Social Needs  . Financial resource strain: None  . Food insecurity - worry: None  . Food insecurity - inability: None  . Transportation needs - medical: None  . Transportation needs - non-medical: None  Occupational History  . None  Tobacco Use  . Smoking status: Never Smoker  . Smokeless tobacco: Never Used  Substance and Sexual Activity  . Alcohol use: No  . Drug use: No  . Sexual activity: Yes    Birth control/protection: Pill, None  Other Topics Concern  . None  Social History Narrative  . None    Family History: History reviewed. No pertinent family history.  Allergies: Allergies  Allergen Reactions  . Ciprofloxacin Hcl Anaphylaxis    Medications Prior to Admission  Medication Sig Dispense Refill Last Dose  . diphenhydrAMINE (BENADRYL)  25 MG tablet Take 25 mg by mouth at bedtime as needed for allergies.   06/12/2017 at Unknown time  . Prenatal Multivit-Min-Fe-FA (PRENATAL VITAMINS PO) Take 2 capsules by mouth daily.    06/13/2017 at Unknown time     Review of Systems   All systems reviewed and negative except as stated in HPI  Blood pressure 112/65, pulse 88, temperature 98.2 F (36.8 C), temperature source Oral, resp. rate 16, height 5\' 9"  (1.753 m), weight 188 lb (85.3 kg), last menstrual period 08/18/2016. General appearance: alert, cooperative and appears stated age Lungs: clear to auscultation bilaterally Heart: regular rate and rhythm Abdomen: soft, non-tender; bowel sounds normal Pelvic: will check in 1-2 hrs  Extremities: Homans sign is negative, no sign of DVT DTR's intact  Presentation: cephalic Fetal monitoringBaseline: 130 bpm Uterine activityNone    Prenatal labs: ABO, Rh: O/Positive/-- (10/08 1408) Antibody: Negative (10/08 1408) Rubella: 1.30 (10/08 1408) RPR: Non Reactive (10/08 1408)  HBsAg: Negative (10/08 1408)  HIV: Non Reactive (10/08 1408)  GBS:   neg 1 hr Glucola  Genetic screening  N/A Anatomy US 03/02/17  Prenatal  Transfer Tool  Maternal Diabetes: No Genetic Screening: Declined Maternal Ultrasounds/Referrals: Normal Fetal Ultrasounds or other Referrals:  None Maternal Substance Abuse:  No Significant Maternal Medications:  None Significant Maternal Lab Results: None  Results for orders placed or performed during the hospital encounter of 06/15/17 (from the past 24 hour(s))  POCT fern test   Collection Time: 06/15/17  9:04 AM  Result Value Ref Range   POCT Fern Test Positive = ruptured amniotic membanes     Patient Active Problem List   Diagnosis Date Noted  . Supervision of other normal pregnancy, antepartum 02/27/2017    Assessment/Plan:  Anna [name] is a 27 y.o. G5P2012 at [redacted]w[redacted]d here for SROM at 7:30 am this morning with clear fluid.   #Labor:SROM at 7:30am , currently no contractions noted, [place] in MAU vertex presentation, will check patient in 1-2 hrs and will start pitocin then if labor has not progressed.  #Pain: Ask if needed  #FWB: Cat 1  #ID:  GBS neg #MOF: Breast #MOC: POP #Circ:  Girl  Courtland Winborne, MD  06/15/2017, 9:13 AM  Midwife attestation: I have seen and examined this patient; I agree with above documentation in the resident's note.   Jaylanie Boschee is a 27 y.o. [phone] here for PROM at term  PE: Gen: calm comfortable, NAD Resp: normal effort, no distress Abd: gravid  ROS, labs, PMH reviewed  Assessment/Plan: Admit to LD Labor: latent, plan for IOL if no labor in 1-2 hrs FWB: Cat I ID: GBS neg  Donette Larry, CNM  06/15/2017, 10:32 AM

## 2017-06-15 NOTE — Anesthesia Procedure Notes (Signed)
Epidural Patient location during procedure: OB Start time: 06/15/2017 3:35 PM End time: 06/15/2017 3:45 PM  Staffing Anesthesiologist: Bethena Midgetddono, Ezrael Sam, MD  Preanesthetic Checklist Completed: patient identified, site marked, surgical consent, pre-op evaluation, timeout performed, IV checked, risks and benefits discussed and monitors and equipment checked  Epidural Patient position: sitting Prep: site prepped and draped and DuraPrep Patient monitoring: continuous pulse ox and blood pressure Approach: midline Location: L3-L4 Injection technique: LOR air  Needle:  Needle type: Tuohy  Needle gauge: 17 G Needle length: 9 cm and 9 Needle insertion depth: 5 cm cm Catheter type: closed end flexible Catheter size: 19 Gauge Catheter at skin depth: 10 cm Test dose: negative  Assessment Events: blood not aspirated, injection not painful, no injection resistance, negative IV test and no paresthesia

## 2017-06-15 NOTE — MAU Note (Signed)
Started leaking at 0730, clear fluid still coming. No bleeding. No pains.  Was 2 cm when last checked.

## 2017-06-16 LAB — RPR: RPR: NONREACTIVE

## 2017-06-16 NOTE — Anesthesia Postprocedure Evaluation (Signed)
Anesthesia Post Note  Patient: Anna [name]  Procedure(s) Performed: AN AD HOC LABOR EPIDURAL     Patient location during evaluation: Mother Baby Anesthesia Type: Epidural Level of consciousness: awake, awake and alert, oriented and patient cooperative Pain management: pain level controlled Vital Signs Assessment: post-procedure vital signs reviewed and stable Respiratory status: spontaneous breathing, nonlabored ventilation and respiratory function stable Cardiovascular status: stable Postop Assessment: no headache, no backache, patient able to bend at knees and no apparent nausea or vomiting Anesthetic complications: no    Last Vitals:  Vitals:   06/15/17 2330 06/16/17 0545  BP: (!) 107/51 100/62  Pulse: 87 81  Resp: 18 16  Temp: 36.8 C 36.6 C  SpO2:      Last Pain:  Vitals:   06/16/17 0720  TempSrc:   PainSc: Asleep   Pain Goal: Patients Stated Pain Goal: 5 (06/15/17 2030)               Duayne Brideau L

## 2017-06-16 NOTE — Progress Notes (Signed)
POSTPARTUM PROGRESS NOTE  Post Partum Day 1  Subjective:  Anna [name] is a 27 y.o. [id number] s/p SVD at 6754w0d.  No acute events overnight.  Pt denies problems with ambulating, voiding or po intake.  Pain is moderately controlled.  She has had flatus. She has had bowel movement.  Lochia Moderate.   Objective: Blood pressure 100/62, pulse 81, temperature 97.9 F (36.6 C), temperature source Oral, resp. rate 16, height 5\' 9"  (1.753 m), weight 85.3 kg (188 lb), last menstrual period 08/18/2016, SpO2 98 %, unknown if currently breastfeeding.  Physical Exam:  General: alert, cooperative and no distress Chest: no respiratory distress Heart:regular rate, distal pulses intact Abdomen: soft, nontender,  Uterine Fundus: firm, appropriately tender DVT Evaluation: No calf swelling or tenderness Extremities: no edema Skin: warm, dry  Recent Labs    06/15/17 0950  HGB 11.2*  HCT 32.7*    Assessment/Plan: Anna Lenteziza Eagleton is a 27 y.o. [id number] s/p SVD at 5054w0d   PPD#1 - Doing well Contraception: POP Feeding: Breast Dispo: Plan for discharge possibly tomorrow.   LOS: 1 day   Mikal PlaneBenjamin A Bortner MS3 06/16/2017, 7:27 AM   OB FELLOW MEDICAL STUDENT NOTE ATTESTATION  I confirm that I have verified the information documented in the medical student's note and that I have also personally performed the physical exam and all medical decision making activities.  Pt doing well PPD#1. VS wnl. Plan to d/c home tomorrow  Frederik PearJulie P Degele, MD OB Fellow 06/16/2017, 9:20 AM

## 2017-06-16 NOTE — Lactation Note (Signed)
This note was copied from a baby'[name] chart. Lactation Consultation Note  Patient Name: Anna [name] ZOXWR'UToday'[name] Date: 06/16/2017 Reason for consult: Initial assessment;Term Breastfeeding consultation services and support information given and reviewed. Newborn is 7013 hours old and latching to breast easily per mom.  Mom breastfed her to previous babies for 7 months.  Instructed to feed with cues and to call for assist prn.  Maternal Data Does the patient have breastfeeding experience prior to this delivery?: Yes  Feeding Feeding Type: Breast Fed Length of feed: 25 min  LATCH Score                   Interventions    Lactation Tools Discussed/Used     Consult Status Consult Status: Follow-up Date: 06/17/17 Follow-up type: In-patient    Anna [name], Anna [name] 06/16/2017, 9:25 AM

## 2017-06-17 LAB — BIRTH TISSUE RECOVERY COLLECTION (PLACENTA DONATION)

## 2017-06-17 MED ORDER — IBUPROFEN 600 MG PO TABS: 600.0000 mg | ORAL_TABLET | Freq: Four times a day (QID) | ORAL | 0 refills | Status: DC

## 2017-06-17 NOTE — Discharge Summary (Signed)
OB Discharge Summary     Patient Name: Anna [name] DOB: Jun 13, 1990 MRN: [id number]  Date of admission: 06/15/2017 Delivering MD: Jacklyn ShellRESENZO-DISHMON, FRANCES   Date of discharge: 06/17/2017  Admitting diagnosis: 40WKS,WATER BROKE Intrauterine pregnancy: 4545w0d     Secondary diagnosis:  Principal Problem:   SVD (spontaneous vaginal delivery) Active Problems:   Indication for care in labor or delivery  Additional problems: None     Discharge diagnosis: Term Pregnancy Delivered                                                                                                Post partum procedures:None  Augmentation: Pitocin  Complications: None  Hospital course:  Onset of Labor With Vaginal Delivery     27 y.o. yo [id number] at 5845w0d was admitted in Latent Labor on 06/15/2017.  She arrived at the MAU for ROM.  Contractions were inconsistent and Pitocin was started.  Patient had an uncomplicated labor course as follows:  Membrane Rupture Time/Date: 7:30 AM ,06/15/2017   Intrapartum Procedures: Episiotomy: None [1]                                         Lacerations:  None [1]  Patient had a delivery of a Viable infant. 06/15/2017  Information for the patient's newborn:  Jesusita Okallen, Girl Aften [[id number]][[id number]]  Delivery Method: Vaginal, Spontaneous(Filed from Delivery Summary)    Pateint had an uncomplicated postpartum course.  She is ambulating, tolerating a regular diet, passing flatus, and urinating well. Patient is discharged home in stable condition on 06/17/17.   Physical exam  Vitals:   06/16/17 0545 06/16/17 1019 06/16/17 1850 06/17/17 0620  BP: 100/62 (!) 100/55 115/70 116/74  Pulse: 81 81 83 67  Resp: 16 16 17 18   Temp: 97.9 F (36.6 C) 98 F (36.7 C) 98 F (36.7 C) 97.7 F (36.5 C)  TempSrc: Oral Oral Oral Oral  SpO2:  99%    Weight:    175 lb 14.4 oz (79.8 kg)  Height:       General: alert, cooperative and no distress Lochia: appropriate Uterine Fundus: firm DVT Evaluation:  No calf swelling or tenderness.  Labs: Lab Results  Component Value Date   WBC 6.7 06/15/2017   HGB 11.2 (L) 06/15/2017   HCT 32.7 (L) 06/15/2017   MCV 96.5 06/15/2017   PLT 155 06/15/2017   No flowsheet data found.  Discharge instruction: per After Visit Summary and "Baby and Me Booklet".  After visit meds:  Allergies as of 06/17/2017      Reactions   Ciprofloxacin Hcl Anaphylaxis      Medication List    TAKE these medications   diphenhydrAMINE 25 MG tablet Commonly known as:  BENADRYL Take 25 mg by mouth at bedtime as needed for allergies.   ibuprofen 600 MG tablet Commonly known as:  ADVIL,MOTRIN Take 1 tablet (600 mg total) by mouth every 6 (six) hours.   PRENATAL VITAMINS PO Take 1 tablet by mouth daily.  Diet: routine diet  Activity: Advance as tolerated. Pelvic rest for 6 weeks.   Outpatient follow up:4 weeks Follow up Appt: Future Appointments  Date Time Provider Department Center  06/20/2017  9:55 AM Judeth Horn, NP [facility]   Postpartum contraception: Combination OCPs  Newborn Data: Live born female  Birth Weight: 8 lb 4.8 oz (3765 g) APGAR: 8, 9  Newborn Delivery   Birth date/time:  06/15/2017 20:00:00 Delivery type:  Vaginal, Spontaneous     Baby Feeding: Breast Disposition:home with mother   06/17/2017 Frederik Pear, MD

## 2017-06-17 NOTE — Discharge Instructions (Signed)
Postpartum Care After Vaginal Delivery °The period of time right after you deliver your newborn is called the postpartum period. °What kind of medical care will I receive? °· You may continue to receive fluids and medicines through an IV tube inserted into one of your veins. °· If an incision was made near your vagina (episiotomy) or if you had some vaginal tearing during delivery, cold compresses may be placed on your episiotomy or your tear. This helps to reduce pain and swelling. °· You may be given a squirt bottle to use when you go to the bathroom. You may use this until you are comfortable wiping as usual. To use the squirt bottle, follow these steps: °? Before you urinate, fill the squirt bottle with warm water. Do not use hot water. °? After you urinate, while you are sitting on the toilet, use the squirt bottle to rinse the area around your urethra and vaginal opening. This rinses away any urine and blood. °? You may do this instead of wiping. As you start healing, you may use the squirt bottle before wiping yourself. Make sure to wipe gently. °? Fill the squirt bottle with clean water every time you use the bathroom. °· You will be given sanitary pads to wear. °How can I expect to feel? °· You may not feel the need to urinate for several hours after delivery. °· You will have some soreness and pain in your abdomen and vagina. °· If you are breastfeeding, you may have uterine contractions every time you breastfeed for up to several weeks postpartum. Uterine contractions help your uterus return to its normal size. °· It is normal to have vaginal bleeding (lochia) after delivery. The amount and appearance of lochia is often similar to a menstrual period in the first week after delivery. It will gradually decrease over the next few weeks to a dry, yellow-brown discharge. For most women, lochia stops completely by 6-8 weeks after delivery. Vaginal bleeding can vary from woman to woman. °· Within the first few  days after delivery, you may have breast engorgement. This is when your breasts feel heavy, full, and uncomfortable. Your breasts may also throb and feel hard, tightly stretched, warm, and tender. After this occurs, you may have milk leaking from your breasts. Your health care provider can help you relieve discomfort due to breast engorgement. Breast engorgement should go away within a few days. °· You may feel more sad or worried than normal due to hormonal changes after delivery. These feelings should not last more than a few days. If these feelings do not go away after several days, speak with your health care provider. °How should I care for myself? °· Tell your health care provider if you have pain or discomfort. °· Drink enough water to keep your urine clear or pale yellow. °· Wash your hands thoroughly with soap and water for at least 20 seconds after changing your sanitary pads, after using the toilet, and before holding or feeding your baby. °· If you are not breastfeeding, avoid touching your breasts a lot. Doing this can make your breasts produce more milk. °· If you become weak or lightheaded, or you feel like you might faint, ask for help before: °? Getting out of bed. °? Showering. °· Change your sanitary pads frequently. Watch for any changes in your flow, such as a sudden increase in volume, a change in color, the passing of large blood clots. If you pass a blood clot from your vagina, save it   to show to your health care provider. Do not flush blood clots down the toilet without having your health care provider look at them. °· Make sure that all your vaccinations are up to date. This can help protect you and your baby from getting certain diseases. You may need to have immunizations done before you leave the hospital. °· If desired, talk with your health care provider about methods of family planning or birth control (contraception). °How can I start bonding with my baby? °Spending as much time as  possible with your baby is very important. During this time, you and your baby can get to know each other and develop a bond. Having your baby stay with you in your room (rooming in) can give you time to get to know your baby. Rooming in can also help you become comfortable caring for your baby. Breastfeeding can also help you bond with your baby. °How can I plan for returning home with my baby? °· Make sure that you have a car seat installed in your vehicle. °? Your car seat should be checked by a certified car seat installer to make sure that it is installed safely. °? Make sure that your baby fits into the car seat safely. °· Ask your health care provider any questions you have about caring for yourself or your baby. Make sure that you are able to contact your health care provider with any questions after leaving the hospital. °This information is not intended to replace advice given to you by your health care provider. Make sure you discuss any questions you have with your health care provider. °Document Released: 03/13/2007 Document Revised: 10/19/2015 Document Reviewed: 04/20/2015 °Elsevier Interactive Patient Education © 2018 [employer] ° °

## 2017-06-17 NOTE — Progress Notes (Addendum)
Discharge instructions given to patient and she verbalized understanding of all instructions given. Written copy of AVS given to patient. Discharged to room-in with infant.

## 2017-06-17 NOTE — Lactation Note (Signed)
This note was copied from a baby's chart. Lactation Consultation Note  Patient Name: Anna [name] UVOZD'GToday's Date: 06/17/2017 Reason for consult: Follow-up assessment;Infant weight loss(9% weight loss, LC updated the doc flow sheets )  Baby is 838 hours old  LC updated the doc flow sheets per mom  Mom woke baby up to feed, diaper dry.  LC reviewed breast massage, hand express, and latching with depth/ per mom more comfortable and not painful  Depth , swallows noted, baby sluggish. Last feeding for 20 mins.  Sore nipple and engorgement prevention and tx reviewed . LC instructed mom on the use of shells, and hand pump.  Per mom will have a DEBP at home.  Mother informed of post-discharge support and given phone number to the lactation department, including services for phone call assistance; out-patient appointments; and breastfeeding support group. List of other breastfeeding resources in the community given in the handout. Encouraged mother to call for problems or concerns related to breastfeeding.    Maternal Data Has patient been taught Hand Expression?: Yes  Feeding Feeding Type: Breast Fed Length of feed: (swallows noted )  LATCH Score Latch: Grasps breast easily, tongue down, lips flanged, rhythmical sucking.  Audible Swallowing: A few with stimulation  Type of Nipple: Everted at rest and after stimulation  Comfort (Breast/Nipple): Soft / non-tender  Hold (Positioning): Assistance needed to correctly position infant at breast and maintain latch.  LATCH Score: 8  Interventions Interventions: Breast feeding basics reviewed;Assisted with latch;Skin to skin;Breast massage;Hand express;Breast compression;Adjust position;Support pillows;Position options;Shells;Hand pump  Lactation Tools Discussed/Used Tools: Shells;Pump Shell Type: Inverted Breast pump type: Manual WIC Program: No Pump Review: Milk Storage;Setup, frequency, and cleaning Initiated by:: MAI  Date  initiated:: 06/17/17   Consult Status Consult Status: Complete Date: 06/17/17 Follow-up type: In-patient    Matilde SprangMargaret Ann Raeli Wiens 06/17/2017, 10:49 AM

## 2017-06-18 ENCOUNTER — Ambulatory Visit: Payer: Self-pay

## 2017-06-18 NOTE — Lactation Note (Signed)
This note was copied from a baby's chart. Lactation Consultation Note  Patient Name: Anna [name]: 06/18/2017 Reason for consult: Follow-up assessment;Infant weight loss   Follow up with mom of 63 hour old infant. Mom called out to say she was getting ready to feed infant. Infant with 12 BF for 14-40 minutes, 6 voids and 3 stools in the last 24 hours. Infant weight 7 lb 4.2 oz with 12 % weight loss since birth. LATCH scores 9.   Mom reports infant has been feeding well mom with large semi compressible breasts and areola with large everted nipples. Able to hand express a few gtts colostrum. Mom has started pumping and is not getting much volume, RN applied ice x 1 earlier and still did not get much volume.   Mom latched infant to the left breast in the cross cradle hold. Added pillows for support. Infant very sleepy at the breast with little suckling noted. Offered mom 5 fr feeding tube at the breast and mom agreeable. Infant did latch with stronger more rhythmic suckles. Infant was able to transfer 18 ml of formula in about 20 minutes. Infant did need some stimulation to maintain active suckling with feeding.   Reviewed supplementation amounts per hand out with mom. Reviewed starting out on the lower and and increasing amounts every other feeding up to about 45 ml every 3 hours. Can offer more if infant wants it.   Plan:  1. Breast feed infant at the breast with feeding cues with no longer than 3 hours between feeds.  2. If BF with no 5 fr feeding tube limit BF to 20 minutes and supplement infant with a bottle post BF per supplement guidelines 3. If using the 5 french feeding tube at the breast supplement with EBM/formula per supplement guidelines 4. Pump about every 3 hours post BF or if not BF for 15-20 minutes with DEBP, use hands on pumping  5. Call out for assistance as needed  Report to Dolly RiasKim Isley, RN and Suszanne ConnersJennie Riddle, NP. Infant is not to be d/c home today.   Maternal  Data Formula Feeding for Exclusion: No Has patient been taught Hand Expression?: Yes Does the patient have breastfeeding experience prior to this delivery?: Yes  Feeding Feeding Type: Breast Fed Nipple Type: Slow - flow Length of feed: 20 min  LATCH Score Latch: Repeated attempts needed to sustain latch, nipple held in mouth throughout feeding, stimulation needed to elicit sucking reflex.  Audible Swallowing: A few with stimulation  Type of Nipple: Everted at rest and after stimulation  Comfort (Breast/Nipple): Soft / non-tender  Hold (Positioning): Assistance needed to correctly position infant at breast and maintain latch.  LATCH Score: 7  Interventions Interventions: Breast feeding basics reviewed;Adjust position;DEBP;Assisted with latch;Support pillows;Skin to skin;Position options;Breast massage;Breast compression  Lactation Tools Discussed/Used Tools: Pump;52F feeding tube / Syringe Breast pump type: Double-Electric Breast Pump WIC Program: No Pump Review: Setup, frequency, and cleaning;Milk Storage Initiated by:: Reviewed and encouraged every 2-3 hours post BF [name] initiated:: 06/18/17   Consult Status Consult Status: Follow-up [name]: 06/19/17 Follow-up type: In-patient    Anna [name] 06/18/2017, 2:24 PM

## 2017-06-19 ENCOUNTER — Ambulatory Visit: Payer: Self-pay

## 2017-06-19 NOTE — Lactation Note (Signed)
This note was copied from a baby's chart. Lactation Consultation Note  Patient Name: Girl Asa Lenteziza Droll Today's Date: 06/19/2017   Term baby at 4186 hrs old with weight loss of 12%.  Baby breastfeeding and being supplemented with EBM and formula per MD order.  Baby was at 12.5% weight loss yesterday, but his am, baby was at 11.7%.  To weigh baby again this am prior to discharge.  Mom reports sleepiness while at breast, though this morning baby was more active while sucking and swallowing.  Baby receiving 30 ml after breastfeeding.    Mom last pumped 50 ml.  Encouraged Mom to pump at  least after every feeding on the breast, 8 times/24 hrs.  Both breast full and firm.  Ice packs applied for 20-30 mins, and then Mom will double pump after massaging breasts.  Mom has DEBP at home.   Mom aware of OP lactation services available to her.  Encouraged her to call prn for concerns.  Judee ClaraSmith, Tanekia Ryans E  RN IBCLC 06/19/2017, 10:13 AM

## 2017-06-19 NOTE — Addendum Note (Signed)
Addendum  created 06/19/17 0905 by Bethena Midgetddono, Kamarius Buckbee, MD   Sign clinical note

## 2017-06-19 NOTE — Anesthesia Preprocedure Evaluation (Addendum)
Anesthesia Evaluation  Patient identified by MRN, date of birth, ID band Patient awake    Reviewed: Allergy & Precautions, H&P , NPO status , Patient's Chart, lab work & pertinent test results, reviewed documented beta blocker date and time   Airway Mallampati: II  TM Distance: >3 FB Neck ROM: full    Dental no notable dental hx.    Pulmonary neg pulmonary ROS,    Pulmonary exam normal breath sounds clear to auscultation       Cardiovascular negative cardio ROS Normal cardiovascular exam Rhythm:regular Rate:Normal     Neuro/Psych negative neurological ROS  negative psych ROS   GI/Hepatic negative GI ROS, Neg liver ROS,   Endo/Other  negative endocrine ROS  Renal/GU negative Renal ROS  negative genitourinary   Musculoskeletal   Abdominal   Peds  Hematology negative hematology ROS (+)   Anesthesia Other Findings   Reproductive/Obstetrics (+) Pregnancy                             Anesthesia Physical Anesthesia Plan  ASA: II  Anesthesia Plan: Epidural   Post-op Pain Management:    Induction:   PONV Risk Score and Plan:   Airway Management Planned:   Additional Equipment:   Intra-op Plan:   Post-operative Plan:   Informed Consent: I have reviewed the patients History and Physical, chart, labs and discussed the procedure including the risks, benefits and alternatives for the proposed anesthesia with the patient or authorized representative who has indicated his/her understanding and acceptance.     Dental Advisory Given  Plan Discussed with:   Anesthesia Plan Comments: (Labs checked- platelets confirmed with RN in room. Fetal heart tracing, per RN, reported to be stable enough for sitting procedure. Discussed epidural, and patient consents to the procedure:  included risk of possible headache,backache, failed block, allergic reaction, and nerve injury. This patient was asked  if she had any questions or concerns before the procedure started.)        Anesthesia Quick Evaluation  

## 2017-06-20 ENCOUNTER — Encounter: Payer: Medicaid Other | Admitting: Student

## 2017-06-22 ENCOUNTER — Inpatient Hospital Stay (HOSPITAL_COMMUNITY): Payer: Medicaid Other

## 2017-07-25 ENCOUNTER — Ambulatory Visit: Payer: Medicaid Other | Admitting: Medical

## 2017-07-25 ENCOUNTER — Encounter: Payer: Self-pay | Admitting: Medical

## 2017-07-25 ENCOUNTER — Ambulatory Visit (INDEPENDENT_AMBULATORY_CARE_PROVIDER_SITE_OTHER): Payer: Medicaid Other | Admitting: Medical

## 2017-07-25 VITALS — BP 118/83 | HR 68 | Wt 172.0 lb

## 2017-07-25 DIAGNOSIS — Z1389 Encounter for screening for other disorder: Secondary | ICD-10-CM | POA: Diagnosis not present

## 2017-07-25 DIAGNOSIS — Z3009 Encounter for other general counseling and advice on contraception: Secondary | ICD-10-CM | POA: Diagnosis not present

## 2017-07-25 LAB — POCT PREGNANCY, URINE: PREG TEST UR: NEGATIVE

## 2017-07-25 MED ORDER — NORETHINDRONE 0.35 MG PO TABS: 1.0000 | ORAL_TABLET | Freq: Every day | ORAL | 11 refills | Status: DC

## 2017-07-25 NOTE — Progress Notes (Signed)
Subjective:     Anna [name] is a 27 y.o. female who presents for a postpartum visit. She is 6 weeks postpartum following a spontaneous vaginal delivery. I have fully reviewed the prenatal and intrapartum course. The delivery was at 40 gestational weeks. Outcome: spontaneous vaginal delivery. Anesthesia: epidural. Postpartum course has been normal. Baby's course has been normal. Baby is feeding by breast. Bleeding no bleeding. Bowel function is normal. Bladder function is normal. Patient is not sexually active. Contraception method is abstinence. Postpartum depression screening: negative.  The following portions of the patient's history were reviewed and updated as appropriate: allergies, current medications, past family history, past medical history, past social history, past surgical history and problem list.  Review of Systems Pertinent items are noted in HPI.   Objective:    BP 118/83   Pulse 68   Wt 172 lb (78 kg)   Breastfeeding? Yes   BMI 25.40 kg/m   General:  alert and cooperative   Breasts:  not performed  Lungs: clear to auscultation bilaterally  Heart:  regular rate and rhythm, S1, S2 normal, no murmur, click, rub or gallop  Abdomen: soft, non-tender; bowel sounds normal; no masses,  no organomegaly   Vulva:  not evaluated  Vagina: not evaluated  Cervix:  not performed  Corpus: not examined  Adnexa:  not evaluated  Rectal Exam: Not performed.        Assessment:     Normal postpartum exam. Pap smear not done at today's visit.  Last pap smear 05/2016.  Birth control counseling   Plan:    1. Contraception: oral progesterone-only contraceptive. Rx sent to pharmacy with R11 2. Patient advised to call the office if she stops breastfeeding prior to her 1 year follow-up for a change in birth control method. Considering Nuvaring.  3. Follow up in: 1 year for annual exam or sooner as needed.    Marny LowensteinWenzel, Anna [name] N, PA-C 07/25/2017 11:32 AM

## 2017-07-25 NOTE — Patient Instructions (Signed)
Oral Contraception Information Oral contraceptive pills (OCPs) are medicines taken to prevent pregnancy. OCPs work by preventing the ovaries from releasing eggs. The hormones in OCPs also cause the cervical mucus to thicken, preventing the sperm from entering the uterus. The hormones also cause the uterine lining to become thin, not allowing a fertilized egg to attach to the inside of the uterus. OCPs are highly effective when taken exactly as prescribed. However, OCPs do not prevent sexually transmitted diseases (STDs). Safe sex practices, such as using condoms along with the pill, can help prevent STDs. Before taking the pill, you may have a physical exam and Pap test. Your health care provider may order blood tests. The health care provider will make sure you are a good candidate for oral contraception. Discuss with your health care provider the possible side effects of the OCP you may be prescribed. When starting an OCP, it can take 2 to 3 months for the body to adjust to the changes in hormone levels in your body. Types of oral contraception  The combination pill-This pill contains estrogen and progestin (synthetic progesterone) hormones. The combination pill comes in 21-day, 28-day, or 91-day packs. Some types of combination pills are meant to be taken continuously (365-day pills). With 21-day packs, you do not take pills for 7 days after the last pill. With 28-day packs, the pill is taken every day. The last 7 pills are without hormones. Certain types of pills have more than 21 hormone-containing pills. With 91-day packs, the first 84 pills contain both hormones, and the last 7 pills contain no hormones or contain estrogen only.  The minipill-This pill contains the progesterone hormone only. The pill is taken every day continuously. It is very important to take the pill at the same time each day. The minipill comes in packs of 28 pills. All 28 pills contain the hormone. Advantages of oral  contraceptive pills  Decreases premenstrual symptoms.  Treats menstrual period cramps.  Regulates the menstrual cycle.  Decreases a heavy menstrual flow.  May treatacne, depending on the type of pill.  Treats abnormal uterine bleeding.  Treats polycystic ovarian syndrome.  Treats endometriosis.  Can be used as emergency contraception. Things that can make oral contraceptive pills less effective OCPs can be less effective if:  You forget to take the pill at the same time every day.  You have a stomach or intestinal disease that lessens the absorption of the pill.  You take OCPs with other medicines that make OCPs less effective, such as antibiotics, certain HIV medicines, and some seizure medicines.  You take expired OCPs.  You forget to restart the pill on day 7, when using the packs of 21 pills.  Risks associated with oral contraceptive pills Oral contraceptive pills can sometimes cause side effects, such as:  Headache.  Nausea.  Breast tenderness.  Irregular bleeding or spotting.  Combination pills are also associated with a small increased risk of:  Blood clots.  Heart attack.  Stroke.  This information is not intended to replace advice given to you by your health care provider. Make sure you discuss any questions you have with your health care provider. Document Released: 08/06/2002 Document Revised: 10/22/2015 Document Reviewed: 11/04/2012 Elsevier Interactive Patient Education  2018 Elsevier Inc.  

## 2017-08-10 ENCOUNTER — Encounter: Payer: Self-pay | Admitting: Medical

## 2017-09-09 ENCOUNTER — Other Ambulatory Visit: Payer: Self-pay

## 2017-09-09 ENCOUNTER — Encounter (HOSPITAL_COMMUNITY): Payer: Self-pay | Admitting: Emergency Medicine

## 2017-09-09 ENCOUNTER — Emergency Department (HOSPITAL_COMMUNITY)
Admission: EM | Admit: 2017-09-09 | Discharge: 2017-09-09 | Disposition: A | Discharge status: Home / Self Care | Payer: Medicaid Other | Attending: Emergency Medicine | Admitting: Emergency Medicine

## 2017-09-09 DIAGNOSIS — R51 Headache: Secondary | ICD-10-CM | POA: Diagnosis present

## 2017-09-09 DIAGNOSIS — Z79899 Other long term (current) drug therapy: Secondary | ICD-10-CM | POA: Diagnosis not present

## 2017-09-09 DIAGNOSIS — J309 Allergic rhinitis, unspecified: Secondary | ICD-10-CM | POA: Insufficient documentation

## 2017-09-09 DIAGNOSIS — J0100 Acute maxillary sinusitis, unspecified: Secondary | ICD-10-CM | POA: Diagnosis not present

## 2017-09-09 DIAGNOSIS — J302 Other seasonal allergic rhinitis: Secondary | ICD-10-CM

## 2017-09-09 MED ORDER — AZITHROMYCIN 250 MG PO TABS: 250.0000 mg | ORAL_TABLET | Freq: Every day | ORAL | 0 refills | Status: DC

## 2017-09-09 MED ORDER — CETIRIZINE HCL 10 MG PO TABS: 10.0000 mg | ORAL_TABLET | Freq: Every day | ORAL | 1 refills | Status: DC

## 2017-09-09 MED ORDER — FLUTICASONE PROPIONATE 50 MCG/ACT NA SUSP: 2.0000 | Freq: Every day | NASAL | 2 refills | Status: DC

## 2017-09-09 NOTE — ED Provider Notes (Signed)
[facility] EMERGENCY DEPARTMENT Provider Note   CSN: [id number] Arrival date & time: 09/09/17  2114     History   Chief Complaint Chief Complaint  Patient presents with  . Facial Pain    HPI Anna [name] is a 27 y.o. female with a hx of seasonal allergies presents to the Emergency Department complaining of gradual, persistent, progressively worsening right-sided facial pain onset approximately 10 days ago.  Patient reports that approximately 2 weeks ago she had a URI with nasal congestion, cough, rhinorrhea.  She reports that the symptoms improved and then returned 7-10 days ago.  She reports a worsening pressure and pain in the right side of her face and right ear.  She reports some nasal congestion and postnasal drip.  Less rhinorrhea time.  She denies fever, chills, neck pain, neck stiffness, shortness of breath, abdominal pain, vomiting, diarrhea, weakness.  Patient also denies purulent drainage from her nose.  No aggravating or alleviating factors.  She has not tried any medications prior to arrival.  The history is provided by the patient and medical records. No language interpreter was used.    Past Medical History:  Diagnosis Date  . Medical history non-contributory     There are no active problems to display for this patient.   Past Surgical History:  Procedure Laterality Date  . NO PAST SURGERIES       OB History    Gravida  4   Para  3   Term  3   Preterm      AB  1   Living  3     SAB      TAB  1   Ectopic      Multiple  0   Live Births  3            Home Medications    Prior to Admission medications   Medication Sig Start Date End Date Taking? Authorizing Provider  azithromycin (ZITHROMAX) 250 MG tablet Take 1 tablet (250 mg total) by mouth daily. Take first 2 tablets together, then 1 every day until finished. 09/09/17   Taray Normoyle, Dahlia Client, PA-C  cetirizine (ZYRTEC ALLERGY) 10 MG tablet Take 1 tablet (10 mg total)  by mouth daily. 09/09/17   Keyontae Huckeby, Dahlia Client, PA-C  diphenhydrAMINE (BENADRYL) 25 MG tablet Take 25 mg by mouth at bedtime as needed for allergies.    [provider]  fluticasone (FLONASE) 50 MCG/ACT nasal spray Place 2 sprays into both nostrils daily. 09/09/17   Candela Krul, Dahlia Client, PA-C  ibuprofen (ADVIL,MOTRIN) 600 MG tablet Take 1 tablet (600 mg total) by mouth every 6 (six) hours. 06/17/17   Degele, Kandra Nicolas, MD  norethindrone (ORTHO MICRONOR) 0.35 MG tablet Take 1 tablet (0.35 mg total) by mouth daily. 07/25/17   Marny Lowenstein, PA-C  Prenatal Multivit-Min-Fe-FA (PRENATAL VITAMINS PO) Take 1 tablet by mouth daily.     [provider]    Family History No family history on file.  Social History Social History   Tobacco Use  . Smoking status: Never Smoker  . Smokeless tobacco: Never Used  Substance Use Topics  . Alcohol use: No  . Drug use: No     Allergies   Ciprofloxacin hcl   Review of Systems Review of Systems  Constitutional: Negative for appetite change, chills, fatigue and fever.  HENT: Positive for congestion, postnasal drip and sinus pressure. Negative for ear discharge, ear pain, mouth sores, rhinorrhea and sore throat.   Eyes: Negative for  visual disturbance.  Respiratory: Negative for cough, chest tightness, shortness of breath, wheezing and stridor.   Cardiovascular: Negative for chest pain, palpitations and leg swelling.  Gastrointestinal: Negative for abdominal pain, diarrhea, nausea and vomiting.  Genitourinary: Negative for dysuria, frequency, hematuria and urgency.  Musculoskeletal: Negative for arthralgias, back pain, myalgias and neck stiffness.  Skin: Negative for rash.  Neurological: Positive for headaches ( Right frontal). Negative for syncope, light-headedness and numbness.  Hematological: Negative for adenopathy.  Psychiatric/Behavioral: The patient is not nervous/anxious.   All other systems reviewed and are  negative.    Physical Exam Updated Vital Signs BP 119/84 (BP Location: Right Arm)   Pulse 78   Temp 98.3 F (36.8 C) (Oral)   Resp 16   SpO2 100%   Physical Exam  Constitutional: She appears well-developed and well-nourished. No distress.  HENT:  Head: Normocephalic and atraumatic.  Right Ear: External ear and ear canal normal. Tympanic membrane is bulging. A middle ear effusion ( Clear) is present.  Left Ear: Tympanic membrane, external ear and ear canal normal.  Nose: Mucosal edema present. No rhinorrhea. No epistaxis. Right sinus exhibits no maxillary sinus tenderness and no frontal sinus tenderness. Left sinus exhibits no maxillary sinus tenderness and no frontal sinus tenderness.  Mouth/Throat: Uvula is midline and mucous membranes are normal. Mucous membranes are not pale and not cyanotic. No oropharyngeal exudate, posterior oropharyngeal edema, posterior oropharyngeal erythema or tonsillar abscesses.  Right TM bulging with clear middle ear effusion  Eyes: Pupils are equal, round, and reactive to light. Conjunctivae are normal.  Neck: Normal range of motion and full passive range of motion without pain.  Cardiovascular: Normal rate and intact distal pulses.  Pulmonary/Chest: Effort normal and breath sounds normal. No stridor.  Clear and equal breath sounds without focal wheezes, rhonchi, rales  Abdominal: Soft. There is no tenderness.  Musculoskeletal: Normal range of motion.  Lymphadenopathy:    She has no cervical adenopathy.  Neurological: She is alert.  Skin: Skin is warm and dry. No rash noted. She is not diaphoretic.  Psychiatric: She has a normal mood and affect.  Nursing note and vitals reviewed.    ED Treatments / Results   Procedures Procedures (including critical care time)  Medications Ordered in ED Medications - No data to display   Initial Impression / Assessment and Plan / ED Course  I have reviewed the triage vital signs and the nursing  notes.  Pertinent labs & imaging results that were available during my care of the patient were reviewed by me and considered in my medical decision making (see chart for details).     Patient complaining of symptoms of sinusitis.  Severe symptoms have been present for greater approx 10 days with maxillary sinus pain.  Concern for acute bacterial rhinosinusitis.  Patient discharged with antibiotics.  Instructions given for warm saline nasal wash and recommendations for follow-up with primary care physician.  Patient also given symptomatic treatment for her seasonal allergies.    Final Clinical Impressions(s) / ED Diagnoses   Final diagnoses:  Acute non-recurrent maxillary sinusitis  Seasonal allergies    ED Discharge Orders        Ordered    azithromycin (ZITHROMAX) 250 MG tablet  Daily     09/09/17 2331    fluticasone (FLONASE) 50 MCG/ACT nasal spray  Daily     09/09/17 2331    cetirizine (ZYRTEC ALLERGY) 10 MG tablet  Daily     09/09/17 2331  Susann Lawhorne, Boyd Kerbs 09/10/17 Jorje Guild    Shaune Pollack, MD 09/10/17 [phone]

## 2017-09-09 NOTE — Discharge Instructions (Addendum)
1. Medications: flonase, zyrtec, azithromycin, usual home medications 2. Treatment: rest, drink plenty of fluids, take tylenol or ibuprofen for pain control 3. Follow Up: Please followup with your primary doctor in 3 days for discussion of your diagnoses and further evaluation after today's visit; if you do not have a primary care doctor use the resource guide provided to find one; Return to the ER for high fevers, difficulty breathing or other concerning symptoms

## 2017-09-09 NOTE — ED Triage Notes (Signed)
Pt states she has had pain in her face on the right side for 2 weeks. States in the past she's had similar pain and was diagnosed with sinusitis.  No fevers, chills, or drainage.

## 2017-09-25 ENCOUNTER — Encounter: Payer: Self-pay | Admitting: Medical

## 2017-10-17 ENCOUNTER — Ambulatory Visit: Payer: Medicaid Other | Admitting: Medical

## 2017-10-17 ENCOUNTER — Encounter: Payer: Self-pay | Admitting: General Practice

## 2017-10-17 NOTE — Progress Notes (Unsigned)
Patient no showed for appt today. Per Vonzella Nipple, patient can reschedule on her own.

## 2018-02-12 ENCOUNTER — Ambulatory Visit (INDEPENDENT_AMBULATORY_CARE_PROVIDER_SITE_OTHER): Payer: Medicaid Other | Admitting: Obstetrics & Gynecology

## 2018-02-12 VITALS — BP 124/73 | HR 87 | Ht 68.0 in | Wt 179.6 lb

## 2018-02-12 DIAGNOSIS — Z3009 Encounter for other general counseling and advice on contraception: Secondary | ICD-10-CM

## 2018-02-12 DIAGNOSIS — IMO0001 Reserved for inherently not codable concepts without codable children: Secondary | ICD-10-CM

## 2018-02-12 NOTE — Progress Notes (Signed)
   Subjective:    Patient ID: Anna [name], female    DOB: 1990/11/03, 27 y.o.   MRN: [id number]  HPI 27 yo single P3 (536, 603, and 688 month old girls (living with FOB) here today to discuss contraception. She quit using the minipill about 2 days (ran out). She last had sex yesterday, unprotected except for withdrawal. She would like   Review of Systems [place] and normal Declines Flu vaccine today Stay at home mom    Objective:   Physical Exam Breathing, conversing, and ambulating normally Well nourished, well hydrated Black female, no apparent distress Abd- benign     Assessment & Plan:  Desire for IUD- had sex yesterday She will need to have NO UNPROTECTED sex for 2 weeks Come back in 2 weeks Declines Gardasil and flu Fasting labs at next visit

## 2018-02-27 ENCOUNTER — Other Ambulatory Visit: Payer: Self-pay | Admitting: *Deleted

## 2018-02-27 DIAGNOSIS — Z3009 Encounter for other general counseling and advice on contraception: Secondary | ICD-10-CM

## 2018-02-28 ENCOUNTER — Ambulatory Visit (INDEPENDENT_AMBULATORY_CARE_PROVIDER_SITE_OTHER): Payer: Medicaid Other | Admitting: Obstetrics & Gynecology

## 2018-02-28 ENCOUNTER — Encounter: Payer: Self-pay | Admitting: Obstetrics & Gynecology

## 2018-02-28 ENCOUNTER — Other Ambulatory Visit: Payer: Medicaid Other

## 2018-02-28 VITALS — BP 116/79 | HR 78 | Ht 69.0 in | Wt 174.0 lb

## 2018-02-28 DIAGNOSIS — Z3009 Encounter for other general counseling and advice on contraception: Secondary | ICD-10-CM | POA: Diagnosis not present

## 2018-02-28 DIAGNOSIS — Z30430 Encounter for insertion of intrauterine contraceptive device: Secondary | ICD-10-CM

## 2018-02-28 DIAGNOSIS — F439 Reaction to severe stress, unspecified: Secondary | ICD-10-CM

## 2018-02-28 DIAGNOSIS — Z3202 Encounter for pregnancy test, result negative: Secondary | ICD-10-CM | POA: Diagnosis not present

## 2018-02-28 LAB — POCT PREGNANCY, URINE: Preg Test, Ur: NEGATIVE

## 2018-02-28 MED ORDER — LEVONORGESTREL 19.5 MCG/DAY IU IUD
INTRAUTERINE_SYSTEM | Freq: Once | INTRAUTERINE | Status: AC
  Administered 2018-02-28: 1 via INTRAUTERINE

## 2018-02-28 NOTE — Patient Instructions (Signed)

## 2018-02-28 NOTE — Progress Notes (Signed)
   Subjective:    Patient ID: Anna [name], female    DOB: 01/31/1991, 27 y.o.   MRN: [id number]  HPI 27 yo single P3 (6, 3 yo kids and a 84 month old) here for IUD insertion. She has not had unprotected sex in 2 weeks.    Review of Systems     Objective:   Physical Exam Breathing, conversing, and ambulating normally Well nourished, well hydrated Black female, no apparent distress UPT negative, consent signed, Time out procedure done. Cervix prepped with betadine and grasped with a single tooth tenaculum. She will see [facility] today Penni Bombard was easily placed and the strings were cut to 3-4 cm. Uterus sounded to 9 cm. She tolerated the procedure well.     Assessment & Plan:  Contraception- IUD/Liletta Rec back up method for 2 weeks Come back for string check/annual in 4 weeks IBU prn She declines a flu vaccine.

## 2018-02-28 NOTE — Addendum Note (Signed)
Addended by: Kathee Delton on: 02/28/2018 10:41 AM   Modules accepted: Orders

## 2018-03-01 LAB — CBC
HEMATOCRIT: 39.3 % (ref 34.0–46.6)
HEMOGLOBIN: 13.3 g/dL (ref 11.1–15.9)
MCH: 30.4 pg (ref 26.6–33.0)
MCHC: 33.8 g/dL (ref 31.5–35.7)
MCV: 90 fL (ref 79–97)
PLATELETS: 230 10*3/uL (ref 150–450)
RBC: 4.38 x10E6/uL (ref 3.77–5.28)
RDW: 12.3 % (ref 12.3–15.4)
WBC: 4.8 10*3/uL (ref 3.4–10.8)

## 2018-03-01 LAB — LIPID PANEL
CHOL/HDL RATIO: 3.9 ratio (ref 0.0–4.4)
Cholesterol, Total: 175 mg/dL (ref 100–199)
HDL: 45 mg/dL (ref >39)
LDL Calculated: 114 mg/dL — ABNORMAL HIGH (ref 0–99)
Triglycerides: 80 mg/dL (ref 0–149)
VLDL CHOLESTEROL CAL: 16 mg/dL (ref 5–40)

## 2018-03-01 LAB — COMP. METABOLIC PANEL (12)
ALK PHOS: 77 IU/L (ref 39–117)
AST: 16 IU/L (ref 0–40)
Albumin/Globulin Ratio: 1.9 (ref 1.2–2.2)
Albumin: 4.8 g/dL (ref 3.5–5.5)
BUN/Creatinine Ratio: 9 (ref 9–23)
BUN: 8 mg/dL (ref 6–20)
Bilirubin Total: 0.6 mg/dL (ref 0.0–1.2)
CALCIUM: 9.5 mg/dL (ref 8.7–10.2)
CREATININE: 0.92 mg/dL (ref 0.57–1.00)
Chloride: 103 mmol/L (ref 96–106)
GFR calc Af Amer: 97 mL/min/{1.73_m2} (ref >59)
GFR, EST NON AFRICAN AMERICAN: 84 mL/min/{1.73_m2} (ref >59)
GLUCOSE: 86 mg/dL (ref 65–99)
Globulin, Total: 2.5 g/dL (ref 1.5–4.5)
Potassium: 4.2 mmol/L (ref 3.5–5.2)
Sodium: 140 mmol/L (ref 134–144)
Total Protein: 7.3 g/dL (ref 6.0–8.5)

## 2018-03-01 LAB — TSH: TSH: 2.06 u[IU]/mL (ref 0.450–4.500)

## 2018-03-01 LAB — VITAMIN D 25 HYDROXY (VIT D DEFICIENCY, FRACTURES): Vit D, 25-Hydroxy: 38.8 ng/mL (ref 30.0–100.0)

## 2018-03-28 ENCOUNTER — Ambulatory Visit (INDEPENDENT_AMBULATORY_CARE_PROVIDER_SITE_OTHER): Payer: Medicaid Other | Admitting: Student

## 2018-03-28 ENCOUNTER — Encounter: Payer: Self-pay | Admitting: Student

## 2018-03-28 ENCOUNTER — Ambulatory Visit: Payer: Medicaid Other | Admitting: Obstetrics & Gynecology

## 2018-03-28 VITALS — BP 120/74 | HR 71 | Wt 175.0 lb

## 2018-03-28 DIAGNOSIS — Z30431 Encounter for routine checking of intrauterine contraceptive device: Secondary | ICD-10-CM

## 2018-03-28 NOTE — Patient Instructions (Signed)

## 2018-03-28 NOTE — Progress Notes (Signed)
     GYNECOLOGY OFFICE PROGRESS NOTE  History:  27 y.o. [id number] here today for today for IUD string check; liletta IUD was placed 02/28/2018. No complaints about the IUD, no concerning side effects.  The following portions of the patient's history were reviewed and updated as appropriate: allergies, current medications, past family history, past medical history, past social history, past surgical history and problem list. Last pap smear in 2018 was normal.  Review of Systems:  Pertinent items are noted in HPI.   Objective:  Physical Exam Blood pressure 120/74, pulse 71, weight 175 lb (79.4 kg), currently breastfeeding. CONSTITUTIONAL: Well-developed, well-nourished female in no acute distress.  ABDOMEN: Soft, no distention noted.   PELVIC: Normal appearing external genitalia; normal appearing vaginal mucosa and cervix.  IUD strings visualized, about 3 cm in length outside cervix.   Assessment & Plan:  Normal IUD check. Patient to keep IUD in place for five years; can come in for removal if she desires pregnancy within the next five years. Routine preventative health maintenance measures emphasized.   Judeth Horn, NP

## 2018-05-08 ENCOUNTER — Ambulatory Visit (INDEPENDENT_AMBULATORY_CARE_PROVIDER_SITE_OTHER): Payer: Medicaid Other | Admitting: Internal Medicine

## 2018-05-08 ENCOUNTER — Encounter: Payer: Self-pay | Admitting: Internal Medicine

## 2018-05-08 VITALS — BP 122/75 | HR 74 | Wt 173.9 lb

## 2018-05-08 DIAGNOSIS — M545 Low back pain, unspecified: Secondary | ICD-10-CM

## 2018-05-08 DIAGNOSIS — R519 Headache, unspecified: Secondary | ICD-10-CM

## 2018-05-08 DIAGNOSIS — R51 Headache: Secondary | ICD-10-CM | POA: Diagnosis not present

## 2018-05-08 LAB — POCT URINALYSIS DIP (DEVICE)
Bilirubin Urine: NEGATIVE
Glucose, UA: NEGATIVE mg/dL
Hgb urine dipstick: NEGATIVE
Ketones, ur: NEGATIVE mg/dL
Nitrite: NEGATIVE
Protein, ur: NEGATIVE mg/dL
Specific Gravity, Urine: 1.02 (ref 1.005–1.030)
Urobilinogen, UA: 0.2 mg/dL (ref 0.0–1.0)
pH: 6.5 (ref 5.0–8.0)

## 2018-05-08 MED ORDER — BUTALBITAL-APAP-CAFFEINE 50-325-40 MG PO TABS: 1.0000 | ORAL_TABLET | Freq: Four times a day (QID) | ORAL | 1 refills | Status: DC | PRN

## 2018-05-08 NOTE — Progress Notes (Signed)
Here because of complaints with IUD. C/o has headaches, pain in lower back, fatigue. C/o flank pain, will do ua.

## 2018-05-08 NOTE — Patient Instructions (Signed)
Your headache can be a side effect of the hormonal contraception. About 16% of patients experience headaches and migraines with the IUD. These will likely stop as your body regulates to the implant. I have prescribed a medicine you can take as needed for headaches. I would recommend that you also see your PCP to discuss this problem especially if it continues.

## 2018-05-08 NOTE — Progress Notes (Signed)
   Subjective:    Anna [name] - 27 y.o. female MRN [id number]  Date of birth: [birth year]  HPI  Anna [name] is a 27 y.o. [phone] female here for concerns of side effects of IUD. Reports IUD was inserted in October. States that since she had IUD inserted she has had migraine type headaches. Has tried taking Ibuprofen a few times. It diminishes the intensity of the headache but does not resolve it.   Also complaints of flank pain and lower back pain. This has been chronic. Denies dysuria, hematuria, frequency and urgency. No fevers.     OB History    Gravida  4   Para  3   Term  3   Preterm      AB  1   Living  3     SAB      TAB  1   Ectopic      Multiple  0   Live Births  3            -  reports that she has never smoked. She has never used smokeless tobacco. - Review of Systems: Per HPI. - Past Medical History: There are no active problems to display for this patient.  - Medications: reviewed and updated   Objective:   Physical Exam BP 122/75   Pulse 74   Wt 173 lb 14.4 oz (78.9 kg)   LMP 04/27/2018   BMI 25.68 kg/m  Gen: NAD, alert, cooperative with exam, well-appearing Abd: SNTND, BS present, no guarding or organomegaly MSK: No CVA tenderness.   Assessment & Plan:  1. Nonintractable headache, unspecified chronicity pattern, unspecified headache type Discussed with patient that headache and migraines can occur in about 16% of patients per UTD. Would anticipate these side effects would diminish with time. Have prescribed Fioricet for abortive purposes. Have also recommended touching base with PCP as patient has had a history of headaches prior to IUD insertion.  - ibuprofen (ADVIL,MOTRIN) 200 MG tablet; Take 200 mg by mouth every 6 (six) hours as needed. - butalbital-acetaminophen-caffeine (FIORICET, ESGIC) 50-325-40 MG tablet; Take 1-2 tablets by mouth every 6 (six) hours as needed for headache.  Dispense: 20 tablet; Refill: 1  2. Acute bilateral low  back pain without sciatica Likely MSK. UA negative for concerns for nephrolithiasis and only positive for trace leukocytes. Without other urinary symptoms or vital sign abnormalities, low suspicion for pyelonephritis.  - POCT urinalysis dip (device)    Routine preventative health maintenance measures emphasized. Please refer to After Visit Summary for other counseling recommendations.   Return if symptoms worsen or fail to improve, for annual exam.  Marcy Sirenatherine Chella Chapdelaine, D.O. OB Fellow  05/08/2018, 6:18 PM

## 2018-06-06 ENCOUNTER — Other Ambulatory Visit: Payer: Self-pay

## 2018-06-06 ENCOUNTER — Ambulatory Visit: Payer: Medicaid Other | Attending: Nurse Practitioner | Admitting: Nurse Practitioner

## 2018-06-06 ENCOUNTER — Encounter: Payer: Self-pay | Admitting: Nurse Practitioner

## 2018-06-06 VITALS — BP 115/72 | HR 83 | Temp 98.3°F | Ht 69.0 in | Wt 171.6 lb

## 2018-06-06 DIAGNOSIS — Z1331 Encounter for screening for depression: Secondary | ICD-10-CM | POA: Diagnosis not present

## 2018-06-06 DIAGNOSIS — Z131 Encounter for screening for diabetes mellitus: Secondary | ICD-10-CM | POA: Diagnosis not present

## 2018-06-06 DIAGNOSIS — F331 Major depressive disorder, recurrent, moderate: Secondary | ICD-10-CM | POA: Insufficient documentation

## 2018-06-06 DIAGNOSIS — R519 Headache, unspecified: Secondary | ICD-10-CM

## 2018-06-06 DIAGNOSIS — Z79899 Other long term (current) drug therapy: Secondary | ICD-10-CM | POA: Insufficient documentation

## 2018-06-06 DIAGNOSIS — F5101 Primary insomnia: Secondary | ICD-10-CM | POA: Insufficient documentation

## 2018-06-06 DIAGNOSIS — G4700 Insomnia, unspecified: Secondary | ICD-10-CM | POA: Insufficient documentation

## 2018-06-06 DIAGNOSIS — Z881 Allergy status to other antibiotic agents status: Secondary | ICD-10-CM | POA: Insufficient documentation

## 2018-06-06 DIAGNOSIS — R51 Headache: Secondary | ICD-10-CM | POA: Diagnosis not present

## 2018-06-06 LAB — POCT GLYCOSYLATED HEMOGLOBIN (HGB A1C): Hemoglobin A1C: 5.2 % (ref 4.0–5.6)

## 2018-06-06 MED ORDER — TRAZODONE HCL 50 MG PO TABS: 50.0000 mg | ORAL_TABLET | Freq: Every day | ORAL | 1 refills | Status: DC

## 2018-06-06 MED ORDER — BUTALBITAL-APAP-CAFFEINE 50-325-40 MG PO TABS: 1.0000 | ORAL_TABLET | Freq: Four times a day (QID) | ORAL | 1 refills | Status: DC | PRN

## 2018-06-06 MED ORDER — CYCLOBENZAPRINE HCL 10 MG PO TABS: 10.0000 mg | ORAL_TABLET | Freq: Three times a day (TID) | ORAL | 1 refills | Status: AC | PRN
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
## 2018-06-06 NOTE — Patient Instructions (Signed)

## 2018-07-17 ENCOUNTER — Encounter: Payer: Medicaid Other | Admitting: Nurse Practitioner

## 2018-08-07 ENCOUNTER — Encounter: Payer: Medicaid Other | Admitting: Nurse Practitioner

## 2018-08-24 ENCOUNTER — Telehealth: Payer: Self-pay | Admitting: Nurse Practitioner

## 2018-08-24 NOTE — Telephone Encounter (Signed)
Patient called because she would like to setup an appointment. Patient has congestion in her chest and face and would like to get a antibiotics. Patient also states she has a sore throat.Please follow up

## 2018-08-24 NOTE — Telephone Encounter (Signed)
Patient would like a phone visit with PCP. Scheduled a phone visit on 08/27/2018.

## 2018-08-27 ENCOUNTER — Ambulatory Visit: Payer: Medicaid Other | Attending: Nurse Practitioner | Admitting: Nurse Practitioner

## 2018-08-27 ENCOUNTER — Encounter: Payer: Self-pay | Admitting: Nurse Practitioner

## 2018-08-27 ENCOUNTER — Other Ambulatory Visit: Payer: Self-pay

## 2018-08-27 ENCOUNTER — Encounter: Payer: Medicaid Other | Admitting: Nurse Practitioner

## 2018-08-27 DIAGNOSIS — F5101 Primary insomnia: Secondary | ICD-10-CM

## 2018-08-27 DIAGNOSIS — J0110 Acute frontal sinusitis, unspecified: Secondary | ICD-10-CM

## 2018-08-27 MED ORDER — TRAZODONE HCL 50 MG PO TABS: 50.0000 mg | ORAL_TABLET | Freq: Every day | ORAL | 0 refills | Status: DC

## 2018-08-27 MED ORDER — AMOXICILLIN-POT CLAVULANATE 875-125 MG PO TABS: 1.0000 | ORAL_TABLET | Freq: Two times a day (BID) | ORAL | 0 refills | Status: AC

## 2018-08-27 MED ORDER — FLUTICASONE PROPIONATE 50 MCG/ACT NA SUSP: 2.0000 | Freq: Every day | NASAL | 2 refills | Status: DC

## 2018-08-27 NOTE — Progress Notes (Signed)
Assessment & Plan:  Anna [name] was seen today for sinusitis.  Diagnoses and all orders for this visit:  Acute frontal sinusitis, recurrence not specified -     fluticasone (FLONASE) 50 MCG/ACT nasal spray; Place 2 sprays into both nostrils daily. -     amoxicillin-clavulanate (AUGMENTIN) 875-125 MG tablet; Take 1 tablet by mouth 2 (two) times daily for 10 days.  Primary insomnia -     traZODone (DESYREL) 50 MG tablet; Take 1 tablet (50 mg total) by mouth at bedtime for 30 days.  Moderate episode of recurrent major depressive disorder (HCC) -     traZODone (DESYREL) 50 MG tablet; Take 1 tablet (50 mg total) by mouth at bedtime for 30 days.    Patient has been counseled on age-appropriate routine health concerns for screening and prevention. These are reviewed and up-to-date. Referrals have been placed accordingly. Immunizations are up-to-date or declined.    Subjective:   Chief Complaint  Patient presents with  . Sinusitis    Pt. stated she have chest congestion, eye and nose itchy. Pt. stated she do have mucus but it is not draining. Eyes are swollen. Pt use nasal spray.    Sinusitis  Associated symptoms include congestion and a sore throat. Pertinent negatives include no coughing, headaches or shortness of breath.   Anna [name] 28 y.o. female with complaints of sinus symptoms.  Sinus Pain Patient complains of congestion, nasal congestion, non productive cough, sinus pressure, sneezing and sore throat. Symptoms include itchy watery eyes, chest congestion, facial pain, sinus congestion. Denies  fever, chills, night sweats or weight loss. Onset of symptoms was 3 weeks ago, unchanged since that time. She is drinking plenty of fluids.  Past history is significant for no history of pneumonia or bronchitis. Patient is non-smoker.  Insomnia Taking Trazodone which provides relief of her insomnia.   Review of Systems  Constitutional: Negative for fever, malaise/fatigue and weight loss.   HENT: Positive for congestion, sinus pain and sore throat. Negative for nosebleeds.        SEE HPI  Eyes: Negative for blurred vision, double vision and photophobia.       SEE HPI  Respiratory: Negative.  Negative for cough and shortness of breath.   Cardiovascular: Negative.  Negative for chest pain, palpitations and leg swelling.  Gastrointestinal: Negative.  Negative for heartburn, nausea and vomiting.  Musculoskeletal: Negative.  Negative for myalgias.  Neurological: Negative.  Negative for dizziness, focal weakness, seizures and headaches.  Psychiatric/Behavioral: Negative for suicidal ideas. The patient has insomnia.     Past Medical History:  Diagnosis Date  . Chronic headaches   . Insomnia   . Medical history non-contributory     Past Surgical History:  Procedure Laterality Date  . NO PAST SURGERIES      Family History  Problem Relation Age of Onset  . Depression Mother   . Depression Father     Social History Reviewed with no changes to be made today.   Outpatient Medications Prior to Visit  Medication Sig Dispense Refill  . diphenhydrAMINE (BENADRYL) 25 MG tablet Take 25 mg by mouth at bedtime as needed for allergies.    . Multiple Vitamins-Minerals (HAIR SKIN AND NAILS FORMULA PO) Take 1 tablet by mouth.    . fluticasone (FLONASE) 50 MCG/ACT nasal spray Place 2 sprays into both nostrils daily. 9.9 g 2  . butalbital-acetaminophen-caffeine (FIORICET, ESGIC) 50-325-40 MG tablet Take 1-2 tablets by mouth every 6 (six) hours as needed for headache. (Patient not  taking: Reported on 08/27/2018) 20 tablet 1  . traZODone (DESYREL) 50 MG tablet Take 1 tablet (50 mg total) by mouth at bedtime. 30 tablet 1   No facility-administered medications prior to visit.     Allergies  Allergen Reactions  . Ciprofloxacin Hcl Anaphylaxis       Objective:    There were no vitals taken for this visit. Wt Readings from Last 3 Encounters:  06/06/18 171 lb 9.6 oz (77.8 kg)   05/08/18 173 lb 14.4 oz (78.9 kg)  03/28/18 175 lb (79.4 kg)          Patient has been counseled extensively about nutrition and exercise as well as the importance of adherence with medications and regular follow-up. The patient was given clear instructions to go to ER or return to medical center if symptoms don't improve, worsen or new problems develop. The patient verbalized understanding.   Follow-up: No follow-ups on file.   Claiborne Rigg, FNP-BC [facility] and [facility] [place], [place] [phone]   08/27/2018, 11:58 AM

## 2018-08-27 NOTE — Telephone Encounter (Signed)
Please advise patient on this concern.

## 2018-08-30 ENCOUNTER — Encounter: Payer: Self-pay | Admitting: Nurse Practitioner

## 2018-08-30 NOTE — Telephone Encounter (Signed)
Please advise patient  on staying out of work with new onset of symptoms.

## 2018-08-31 ENCOUNTER — Encounter: Payer: Self-pay | Admitting: Nurse Practitioner

## 2018-08-31 ENCOUNTER — Telehealth: Payer: Self-pay | Admitting: Emergency Medicine

## 2018-08-31 DIAGNOSIS — R51 Headache: Secondary | ICD-10-CM | POA: Diagnosis not present

## 2018-08-31 DIAGNOSIS — R05 Cough: Secondary | ICD-10-CM | POA: Diagnosis not present

## 2018-08-31 DIAGNOSIS — J029 Acute pharyngitis, unspecified: Secondary | ICD-10-CM | POA: Diagnosis not present

## 2018-08-31 DIAGNOSIS — R6883 Chills (without fever): Secondary | ICD-10-CM | POA: Diagnosis not present

## 2018-08-31 DIAGNOSIS — R197 Diarrhea, unspecified: Secondary | ICD-10-CM | POA: Diagnosis not present

## 2018-08-31 NOTE — Telephone Encounter (Signed)
Pt called asking about Covid

## 2018-08-31 NOTE — Telephone Encounter (Signed)
Patients call taken.  Patient identified by name and date of birth. Patient states she has flu like symptoms.  Intermittent fevers of 100 or less, diarrhea, headaches and periods of hot and cold spells. Patient endorses diarrhea. Patient concern for testing.    Patient advised that she should go to Urgent Care and be accessed and tested for flu or what the provider at [facility] determines

## 2018-08-31 NOTE — Telephone Encounter (Signed)
Mychart concern

## 2018-09-25 ENCOUNTER — Encounter: Payer: Self-pay | Admitting: Nurse Practitioner

## 2018-09-26 ENCOUNTER — Encounter: Payer: Self-pay | Admitting: Nurse Practitioner

## 2018-10-03 DIAGNOSIS — J019 Acute sinusitis, unspecified: Secondary | ICD-10-CM | POA: Diagnosis not present

## 2018-11-05 ENCOUNTER — Telehealth: Payer: Self-pay | Admitting: Family Medicine

## 2018-11-05 NOTE — Telephone Encounter (Signed)
Scheduled appointment to get IUD removed, and discuss other options.

## 2018-11-08 ENCOUNTER — Encounter: Payer: Self-pay | Admitting: Nurse Practitioner

## 2018-11-09 NOTE — Telephone Encounter (Signed)
ENT referral requested via mychart.

## 2018-11-12 ENCOUNTER — Other Ambulatory Visit: Payer: Self-pay | Admitting: Nurse Practitioner

## 2018-11-12 DIAGNOSIS — J309 Allergic rhinitis, unspecified: Secondary | ICD-10-CM

## 2018-11-15 DIAGNOSIS — R05 Cough: Secondary | ICD-10-CM | POA: Diagnosis not present

## 2018-11-15 DIAGNOSIS — R110 Nausea: Secondary | ICD-10-CM | POA: Diagnosis not present

## 2018-11-15 DIAGNOSIS — Z1159 Encounter for screening for other viral diseases: Secondary | ICD-10-CM | POA: Diagnosis not present

## 2018-11-15 DIAGNOSIS — R197 Diarrhea, unspecified: Secondary | ICD-10-CM | POA: Diagnosis not present

## 2018-11-15 DIAGNOSIS — R509 Fever, unspecified: Secondary | ICD-10-CM | POA: Diagnosis not present

## 2018-11-15 DIAGNOSIS — R51 Headache: Secondary | ICD-10-CM | POA: Diagnosis not present

## 2018-11-27 ENCOUNTER — Other Ambulatory Visit: Payer: Self-pay

## 2018-11-27 ENCOUNTER — Encounter: Payer: Self-pay | Admitting: Allergy and Immunology

## 2018-11-27 ENCOUNTER — Ambulatory Visit (INDEPENDENT_AMBULATORY_CARE_PROVIDER_SITE_OTHER): Payer: Medicaid Other | Admitting: Allergy and Immunology

## 2018-11-27 VITALS — BP 120/86 | HR 85 | Temp 98.7°F | Resp 16 | Ht 69.0 in | Wt 157.0 lb

## 2018-11-27 DIAGNOSIS — K219 Gastro-esophageal reflux disease without esophagitis: Secondary | ICD-10-CM

## 2018-11-27 DIAGNOSIS — G478 Other sleep disorders: Secondary | ICD-10-CM | POA: Diagnosis not present

## 2018-11-27 DIAGNOSIS — J3089 Other allergic rhinitis: Secondary | ICD-10-CM | POA: Diagnosis not present

## 2018-11-27 DIAGNOSIS — J301 Allergic rhinitis due to pollen: Secondary | ICD-10-CM

## 2018-11-27 DIAGNOSIS — G43909 Migraine, unspecified, not intractable, without status migrainosus: Secondary | ICD-10-CM | POA: Diagnosis not present

## 2018-11-27 MED ORDER — OMEPRAZOLE 40 MG PO CPDR: 40.0000 mg | DELAYED_RELEASE_CAPSULE | Freq: Every day | ORAL | 5 refills | Status: DC

## 2018-11-27 MED ORDER — CETIRIZINE HCL 10 MG PO TABS: 10.0000 mg | ORAL_TABLET | Freq: Every day | ORAL | 5 refills | Status: DC

## 2018-11-27 MED ORDER — CYPROHEPTADINE HCL 4 MG PO TABS: 4.0000 mg | ORAL_TABLET | Freq: Every day | ORAL | 5 refills | Status: DC

## 2018-11-27 MED ORDER — FLUTICASONE PROPIONATE 50 MCG/ACT NA SUSP: 2.0000 | Freq: Every day | NASAL | 2 refills | Status: DC

## 2018-11-27 MED ORDER — MONTELUKAST SODIUM 10 MG PO TABS: 10.0000 mg | ORAL_TABLET | Freq: Every day | ORAL | 5 refills | Status: DC

## 2018-11-27 MED ORDER — PAZEO 0.7 % OP SOLN: 1.0000 [drp] | OPHTHALMIC | 5 refills | Status: DC

## 2018-11-27 NOTE — Patient Instructions (Addendum)
  1.  Allergen avoidance measures - Area 2 aeroallergen profile  2.  Treat and prevent inflammation:   A.  Flonase - 1-2 sprays each nostril daily  B.  Montelukast 10 mg - 1 tablet daily  3.  Treat and prevent headache and sleep dysfunction:   A.  Periactin 4 mg - 1 tablet at bedtime  4.  Treat and prevent reflux:   A.  Eliminate all forms of caffeine including chocolate  B.  Omeprazole 40 mg - 1 tablet daily  5.  If needed:   A.  Nasal saline  B.  Cetirizine 10 mg - 1 tablet daily  C.  Pazeo - 1 drop each eye daily  6.  Return to clinic in 4 weeks or earlier if problem

## 2018-11-27 NOTE — Progress Notes (Signed)
[place]   Dear Bertram DenverZelda Fleming,  [name] [place] [facility]/2018.   Below is a summation of this patient's evaluation and recommendations.  Thank you for your referral. I will keep you informed about this patient's response to treatment.   If you have any questions please do not hesitate to contact me.   Sincerely,  Jessica PriestEric J. Liane Tribbey, MD Allergy / Immunology [facility] [place]   ______________________________________________________________________    NEW PATIENT NOTE  Referring Provider: Claiborne RiggFleming, Zelda W, NP Primary Provider: Claiborne RiggFleming, Zelda W, NP Date of office visit: 11/27/2018    Subjective:   Chief Complaint:  Anna [name] (DOB: 1991/03/30) is a 28 y.o. female who presents to the clinic on 11/27/2018 with a chief complaint of Allergic Rhinitis  (Sore throat) and Food Intolerance (Dye) .     HPI: Anna [name] presents to this clinic in evaluation of a multitude of issues.  First, she has nasal congestion and sneezing and nasal itching and itchy red watery eyes and throat itching that appears to occur on a perennial basis and flares during the spring and fall especially following exposure to the outdoors and exposure to dust of many years duration.  In addition, she gets "sinusitis" multiple times per year manifested as a severe facial pulsating headache with nasal congestion usually treated with antibiotics.  Second, she has chronic headaches occurring on a daily basis.  They are located in the periorbital and facial and frontal region and sometimes the top of her head that usually develop midday and continue until she goes to bed at night after taking a Benadryl.  Sometimes she will take ibuprofen.  Associated with these headaches is a pressure phenomena in her head and spots in her vision and some decreased visual  acuity in general with blurriness and nausea.  Sometimes her headaches get so severe she just needs to sit down for relief.  She has been stuck with these headaches for the past 2 years.  Third, she has "horrible" sleep.  She has initiation insomnia of many hours duration and then when she does get to sleep she has fractured sleep where she is up 2-3 times per night.  Sometimes she will wake up with a very dry mouth and some coughing.  She was given trazodone in the past which he uses about twice a week when her sleep gets really bad.  Fourth, she has heartburn very high up in her chest.  This is a daily occurrence.  She drinks lots of water to treat this issue.  She does have lots of mucus stuck in her throat and throat clearing.  She does not have any associated hoarseness or chronic cough.  Fifth, she occasionally gets total body itchiness usually in conjunction with lots of eye and nose symptoms.  She believes that this is secondary to the consumption of food coloring although there is not really a tight temporal relationship between ingestion of these products and the development of this issue.  She has no other associated systemic or constitutional symptoms with this issue.  Past Medical History:  Diagnosis Date  . Chronic headaches   . Insomnia   . Medical history non-contributory     Past Surgical History:  Procedure Laterality Date  . NO PAST SURGERIES      Allergies as of 11/27/2018  Reactions   Ciprofloxacin Hcl Anaphylaxis      Medication List    diphenhydrAMINE 25 MG tablet Commonly known as: BENADRYL Take 25 mg by mouth at bedtime as needed for allergies.   fluticasone 50 MCG/ACT nasal spray Commonly known as: FLONASE Place 2 sprays into both nostrils daily.   HAIR SKIN AND NAILS FORMULA PO Take 1 tablet by mouth.   traZODone 50 MG tablet Commonly known as: DESYREL Take 1 tablet (50 mg total) by mouth at bedtime for 30 days.       Review of systems  negative except as noted in HPI / PMHx or noted below:  Review of Systems  Constitutional: Negative.   HENT: Negative.   Eyes: Negative.   Respiratory: Negative.   Cardiovascular: Negative.   Gastrointestinal: Negative.   Genitourinary: Negative.   Musculoskeletal: Negative.   Skin: Negative.   Neurological: Negative.   Endo/Heme/Allergies: Negative.   Psychiatric/Behavioral: Negative.     Family History  Problem Relation Age of Onset  . Depression Mother   . Depression Father     Social History   Socioeconomic History  . Marital status: Single    Spouse name: Not on file  . Number of children: Not on file  . Years of education: Not on file  . Highest education level: Not on file  Occupational History  . Not on file  Social Needs  . Financial resource strain: Not on file  . Food insecurity    Worry: Not on file    Inability: Not on file  . Transportation needs    Medical: Not on file    Non-medical: Not on file  Tobacco Use  . Smoking status: Never Smoker  . Smokeless tobacco: Never Used  Substance and Sexual Activity  . Alcohol use: No  . Drug use: No  . Sexual activity: Yes    Birth control/protection: I.U.D.  Lifestyle  . Physical activity    Days per week: Not on file    Minutes per session: Not on file  . Stress: Not on file  Relationships  . Social Musicianconnections    Talks on phone: Not on file    Gets together: Not on file    Attends religious service: Not on file    Active member of club or organization: Not on file    Attends meetings of clubs or organizations: Not on file    Relationship status: Not on file  . Intimate partner violence    Fear of current or ex partner: Not on file    Emotionally abused: Not on file    Physically abused: Not on file    Forced sexual activity: Not on file  Other Topics Concern  . Not on file  Social History Narrative  . Not on file    Environmental and Social history  Lives in a apartment with a dry  environment, no animals located inside the household, carpet in the bedroom, plastic on the bed, no plastic on the pillow, no smoking ongoing with inside the household.  She works in the Equities traderT field.  Objective:   Vitals:   11/27/18 1005  BP: 120/86  Pulse: 85  Resp: 16  Temp: 98.7 F (37.1 C)  SpO2: 97%   Height: 5\' 9"  (175.3 cm) Weight: 157 lb (71.2 kg)  Physical Exam Constitutional:      Appearance: She is not diaphoretic.     Comments: Nasal crease  HENT:     Head: Normocephalic. No right periorbital  erythema or left periorbital erythema.     Right Ear: Tympanic membrane, ear canal and external ear normal.     Left Ear: Tympanic membrane, ear canal and external ear normal.     Nose: Mucosal edema present. No rhinorrhea.     Mouth/Throat:     Pharynx: Uvula midline. No oropharyngeal exudate.  Eyes:     General: Lids are normal.     Conjunctiva/sclera: Conjunctivae normal.     Pupils: Pupils are equal, round, and reactive to light.  Neck:     Thyroid: No thyromegaly.     Trachea: Trachea normal. No tracheal tenderness or tracheal deviation.  Cardiovascular:     Rate and Rhythm: Normal rate and regular rhythm.     Heart sounds: Normal heart sounds, S1 normal and S2 normal. No murmur.  Pulmonary:     Effort: Pulmonary effort is normal. No respiratory distress.     Breath sounds: Normal breath sounds. No stridor. No wheezing or rales.  Chest:     Chest wall: No tenderness.  Abdominal:     General: There is no distension.     Palpations: Abdomen is soft. There is no mass.     Tenderness: There is no abdominal tenderness. There is no guarding or rebound.  Musculoskeletal:        General: No tenderness.  Lymphadenopathy:     Head:     Right side of head: No tonsillar adenopathy.     Left side of head: No tonsillar adenopathy.     Cervical: No cervical adenopathy.  Skin:    Coloration: Skin is not pale.     Findings: No erythema or rash.     Nails: There is no  clubbing.   Neurological:     Mental Status: She is alert.     Diagnostics: Allergy skin tests were performed.  She demonstrated hypersensitivity against tree pollen.  Assessment and Plan:    1. Perennial allergic rhinitis   2. Seasonal allergic rhinitis due to pollen   3. Migraine syndrome   4. Sleep dysfunction with sleep stage disturbance   5. Gastroesophageal reflux disease, esophagitis presence not specified     1.  Allergen avoidance measures - Area 2 aeroallergen profile  2.  Treat and prevent inflammation:   A.  Flonase - 1-2 sprays each nostril daily  B.  Montelukast 10 mg - 1 tablet daily  3.  Treat and prevent headache and sleep dysfunction:   A.  Periactin 4 mg - 1 tablet at bedtime  4.  Treat and prevent reflux:   A.  Eliminate all forms of caffeine including chocolate  B.  Omeprazole 40 mg - 1 tablet daily  5.  If needed:   A.  Nasal saline  B.  Cetirizine 10 mg - 1 tablet daily  C.  Pazeo - 1 drop each eye daily  6.  Return to clinic in 4 weeks or earlier if problem  Anna [name] appears to have inflammation of her airway, chronic headache, sleep dysfunction, and reflux.  We will attempt to address each of these issues with the therapy noted above and I will see her back in this clinic in 4 weeks or earlier to assess her response to this plan and consider further evaluation and treatment based upon her response.  Jiles Prows, MD Allergy / Immunology [facility]

## 2018-12-01 LAB — ALLERGENS W/TOTAL IGE AREA 2

## 2018-12-03 ENCOUNTER — Other Ambulatory Visit (HOSPITAL_COMMUNITY)
Admission: RE | Admit: 2018-12-03 | Discharge: 2018-12-03 | Disposition: A | Discharge status: Home / Self Care | Payer: Medicaid Other | Source: Ambulatory Visit | Attending: Obstetrics & Gynecology | Admitting: Obstetrics & Gynecology

## 2018-12-03 ENCOUNTER — Other Ambulatory Visit: Payer: Self-pay

## 2018-12-03 ENCOUNTER — Ambulatory Visit (INDEPENDENT_AMBULATORY_CARE_PROVIDER_SITE_OTHER): Payer: Medicaid Other | Admitting: Obstetrics & Gynecology

## 2018-12-03 VITALS — BP 111/65 | HR 71 | Temp 98.6°F | Ht 69.0 in | Wt 160.0 lb

## 2018-12-03 DIAGNOSIS — Z30432 Encounter for removal of intrauterine contraceptive device: Secondary | ICD-10-CM

## 2018-12-03 DIAGNOSIS — R5383 Other fatigue: Secondary | ICD-10-CM | POA: Diagnosis not present

## 2018-12-03 DIAGNOSIS — Z0000 Encounter for general adult medical examination without abnormal findings: Secondary | ICD-10-CM

## 2018-12-03 MED ORDER — NORGESTREL-ETHINYL ESTRADIOL 0.3-30 MG-MCG PO TABS: 1.0000 | ORAL_TABLET | Freq: Every day | ORAL | 11 refills | Status: DC

## 2018-12-03 NOTE — Progress Notes (Signed)
   Subjective:    Patient ID: Anna [name], female    DOB: 04-18-1991, 28 y.o.   MRN: [id number]  HPI  28 yo P3 (all girls) here to have her IUD removed. She has had it since 10/20 and thinks that it is causing acne and fatigue. She has used OCPs in the past and wants to restart them.  Review of Systems     Objective:   Physical Exam Breathing, conversing, and ambulating normally Well nourished, well hydrated Black female, no apparent distress Vulva, vagina, and cervix all normal Liletta IUD easily removed and noted to be intact     Assessment & Plan:  Contraception- Lo ovral prescribed, rec back up method for 2 weeks Preventative care- Pap smear sent today Fatigue- TSH today

## 2018-12-04 ENCOUNTER — Telehealth: Payer: Self-pay

## 2018-12-04 ENCOUNTER — Encounter: Payer: Self-pay | Admitting: *Deleted

## 2018-12-04 LAB — TSH: TSH: 0.941 u[IU]/mL (ref 0.450–4.500)

## 2018-12-04 NOTE — Telephone Encounter (Signed)
See my chart message

## 2018-12-06 ENCOUNTER — Emergency Department (HOSPITAL_COMMUNITY)
Admission: EM | Admit: 2018-12-06 | Discharge: 2018-12-06 | Disposition: A | Discharge status: Home / Self Care | Payer: Medicaid Other | Attending: Emergency Medicine | Admitting: Emergency Medicine

## 2018-12-06 ENCOUNTER — Telehealth: Payer: Self-pay | Admitting: Family Medicine

## 2018-12-06 ENCOUNTER — Other Ambulatory Visit: Payer: Self-pay

## 2018-12-06 DIAGNOSIS — R1030 Lower abdominal pain, unspecified: Secondary | ICD-10-CM | POA: Diagnosis not present

## 2018-12-06 DIAGNOSIS — N939 Abnormal uterine and vaginal bleeding, unspecified: Secondary | ICD-10-CM | POA: Diagnosis not present

## 2018-12-06 DIAGNOSIS — Z79899 Other long term (current) drug therapy: Secondary | ICD-10-CM | POA: Insufficient documentation

## 2018-12-06 DIAGNOSIS — R42 Dizziness and giddiness: Secondary | ICD-10-CM | POA: Insufficient documentation

## 2018-12-06 LAB — CBC WITH DIFFERENTIAL/PLATELET
Abs Immature Granulocytes: 0.01 10*3/uL (ref 0.00–0.07)
Basophils Absolute: 0 10*3/uL (ref 0.0–0.1)
Basophils Relative: 1 %
Eosinophils Absolute: 0.1 10*3/uL (ref 0.0–0.5)
Eosinophils Relative: 2 %
HCT: 41.8 % (ref 36.0–46.0)
Hemoglobin: 13.5 g/dL (ref 12.0–15.0)
Immature Granulocytes: 0 %
Lymphocytes Relative: 42 %
Lymphs Abs: 1.9 10*3/uL (ref 0.7–4.0)
MCH: 30.2 pg (ref 26.0–34.0)
MCHC: 32.3 g/dL (ref 30.0–36.0)
MCV: 93.5 fL (ref 80.0–100.0)
Monocytes Absolute: 0.3 10*3/uL (ref 0.1–1.0)
Monocytes Relative: 7 %
Neutro Abs: 2.1 10*3/uL (ref 1.7–7.7)
Neutrophils Relative %: 48 %
Platelets: 204 10*3/uL (ref 150–400)
RBC: 4.47 MIL/uL (ref 3.87–5.11)
RDW: 12 % (ref 11.5–15.5)
WBC: 4.5 10*3/uL (ref 4.0–10.5)
nRBC: 0 % (ref 0.0–0.2)

## 2018-12-06 LAB — ABO/RH: ABO/RH(D): O POS

## 2018-12-06 LAB — BASIC METABOLIC PANEL
Anion gap: 9 (ref 5–15)
BUN: 5 mg/dL — ABNORMAL LOW (ref 6–20)
CO2: 25 mmol/L (ref 22–32)
Calcium: 9.6 mg/dL (ref 8.9–10.3)
Chloride: 104 mmol/L (ref 98–111)
Creatinine, Ser: 0.81 mg/dL (ref 0.44–1.00)
GFR calc Af Amer: >60 mL/min (ref >60)
GFR calc non Af Amer: >60 mL/min (ref >60)
Glucose, Bld: 74 mg/dL (ref 70–99)
Potassium: 4.9 mmol/L (ref 3.5–5.1)
Sodium: 138 mmol/L (ref 135–145)

## 2018-12-06 LAB — TYPE AND SCREEN
ABO/RH(D): O POS
Antibody Screen: NEGATIVE

## 2018-12-06 LAB — I-STAT BETA HCG BLOOD, ED (MC, WL, AP ONLY): I-stat hCG, quantitative: <5 m[IU]/mL (ref <5)

## 2018-12-06 NOTE — ED Provider Notes (Signed)
[facility] EMERGENCY DEPARTMENT Provider Note   CSN: [id number] Arrival date & time: 12/06/18  1255    History   Chief Complaint Chief Complaint  Patient presents with  . Vaginal Bleeding    HPI Anna [name] is a 28 y.o. female presenting to the ED with complaint of vaginal bleeding that began yesterday. Pt states she had her IUD of 19months removed 2 days ago and only had light spotting. However hyesterday after intercourse she began having heavy vaginal bleeding, more than a period. She states she has passed multiple large clots since yesterday, the bleeding is not worsening or improving.  She states she is going through 1 sanitary napkin about every hour.  She has some mild lower abdominal cramping.  She called her gynecology office, however they scheduled her appointment for the 23rd. She states today she began feeling some mild dizziness. Not on anticoagulation.     The history is provided by the patient.    Past Medical History:  Diagnosis Date  . Chronic headaches   . Insomnia   . Medical history non-contributory     There are no active problems to display for this patient.   Past Surgical History:  Procedure Laterality Date  . NO PAST SURGERIES       OB History    Gravida  4   Para  3   Term  3   Preterm      AB  1   Living  3     SAB      TAB  1   Ectopic      Multiple  0   Live Births  3            Home Medications    Prior to Admission medications   Medication Sig Start Date End Date Taking? Authorizing Provider  cetirizine (ZYRTEC) 10 MG tablet Take 1 tablet (10 mg total) by mouth daily. 11/27/18   Kozlow, Donnamarie Poag, MD  cyproheptadine (PERIACTIN) 4 MG tablet Take 1 tablet (4 mg total) by mouth at bedtime. 11/27/18   Kozlow, Donnamarie Poag, MD  montelukast (SINGULAIR) 10 MG tablet Take 1 tablet (10 mg total) by mouth at bedtime. 11/27/18   Kozlow, Donnamarie Poag, MD  Multiple Vitamins-Minerals (HAIR SKIN AND NAILS FORMULA PO) Take 1  tablet by mouth.    [provider]  norgestrel-ethinyl estradiol (LO/OVRAL) 0.3-30 MG-MCG tablet Take 1 tablet by mouth daily. 12/03/18   Emily Filbert, MD  Olopatadine HCl (PAZEO) 0.7 % SOLN Place 1 drop into both eyes 1 day or 1 dose. 11/27/18   Kozlow, Donnamarie Poag, MD  omeprazole (PRILOSEC) 40 MG capsule Take 1 capsule (40 mg total) by mouth daily. 11/27/18   Kozlow, Donnamarie Poag, MD    Family History Family History  Problem Relation Age of Onset  . Depression Mother   . Depression Father     Social History Social History   Tobacco Use  . Smoking status: Never Smoker  . Smokeless tobacco: Never Used  Substance Use Topics  . Alcohol use: No  . Drug use: No     Allergies   Ciprofloxacin hcl   Review of Systems Review of Systems  Genitourinary: Positive for vaginal bleeding. Negative for vaginal pain.  All other systems reviewed and are negative.    Physical Exam Updated Vital Signs BP 108/72   Pulse 73   Temp 98.5 F (36.9 C) (Oral)   Resp 16   LMP 11/20/2018 (Exact  Date)   SpO2 99%   Physical Exam Vitals signs and nursing note reviewed.  Constitutional:      General: She is not in acute distress.    Appearance: She is well-developed.  HENT:     Head: Normocephalic and atraumatic.  Eyes:     Conjunctiva/sclera: Conjunctivae normal.  Cardiovascular:     Rate and Rhythm: Normal rate and regular rhythm.  Pulmonary:     Effort: Pulmonary effort is normal. No respiratory distress.     Breath sounds: Normal breath sounds.  Abdominal:     General: Bowel sounds are normal.     Palpations: Abdomen is soft.     Tenderness: There is no abdominal tenderness. There is no guarding or rebound.  Genitourinary:    Comments: Exam performed with female RN chaperone present.  There is dark red blood coming from the cervix, however does not appear to be actively or briskly bleeding.  There is some discomfort with palpation of this uterus, however no CMT or adnexal tenderness.   No obvious lacerations present. Skin:    General: Skin is warm.  Neurological:     Mental Status: She is alert.  Psychiatric:        Behavior: Behavior normal.      ED Treatments / Results  Labs (all labs ordered are listed, but only abnormal results are displayed) Labs Reviewed  BASIC METABOLIC PANEL - Abnormal; Notable for the following components:      Result Value   BUN 5 (*)    All other components within normal limits  CBC WITH DIFFERENTIAL/PLATELET  I-STAT BETA HCG BLOOD, ED ([facility], [facility], [facility] ONLY)  TYPE AND SCREEN    EKG None  Radiology No results found.  Procedures Procedures (including critical care time)  Medications Ordered in ED Medications - No data to display   Initial Impression / Assessment and Plan / ED Course  I have reviewed the triage vital signs and the nursing notes.  Pertinent labs & imaging results that were available during my care of the patient were reviewed by me and considered in my medical decision making (see chart for details).        Patient presenting with vaginal bleeding after having her IUD removed 2 days ago.  She had been having some difficulty contacting her gynecology office and states she was only told she may have some spotting after IUD removal.  She presents today with concern for vaginal bleeding that is more than a.  And passing large clots.  She has a mild cramping, however no other associated symptoms.  On exam today there is some dark red blood from the cervix, however does not appear to be briskly bleeding.  Hemoglobin is normal.  Vital signs are stable.  She is in no distress.  Patient discussed with Dr. Clarene DukeLittle.  At this time will discharge with directions for patient to follow-up closely with her gynecologist.  Return precautions discussed, including severely worsening bleeding, lightheadedness, severe abdominal pain.  Patient is agreeable to plan and safe for discharge.  Discussed results, findings, treatment and follow  up. Patient advised of return precautions. Patient verbalized understanding and agreed with plan.   Final Clinical Impressions(s) / ED Diagnoses   Final diagnoses:  Vaginal bleeding    ED Discharge Orders    None       Amarissa Koerner, [place] N, PA-C 12/06/18 1745    Little, Ambrose Finlandachel Morgan, MD 12/06/18 1929

## 2018-12-06 NOTE — ED Notes (Signed)
Discharge instructions discussed with Pt. Pt verbalized understanding. Pt stable and ambulatory.    

## 2018-12-06 NOTE — ED Triage Notes (Signed)
To ED for eval of vaginal bleeding that started 2 days after removal of IUD. Pt states she only had a little spotting right after removal. Pt states the bleeding started a couple of hours after intercourse. Clots and vaginal pain noted per pt. Skin w/d. resp e/u. Appears in nad.

## 2018-12-06 NOTE — Discharge Instructions (Addendum)
If you develop severely worsening bleeding, severe abdominal pain, persistent lightheadedness, you need to be reevaluated in the ER. In the meantime, follow-up closely with your gynecologist.

## 2018-12-06 NOTE — Telephone Encounter (Signed)
The patient would like a call back. Stated she is experiencing heavy bleeding since IUD was removed. She stated the bleeding is not normal.

## 2018-12-07 LAB — CYTOLOGY - PAP
Diagnosis: NEGATIVE
HPV 16/18/45 genotyping: NEGATIVE
HPV: DETECTED — AB

## 2018-12-10 ENCOUNTER — Encounter: Payer: Self-pay | Admitting: Obstetrics & Gynecology

## 2018-12-10 DIAGNOSIS — B977 Papillomavirus as the cause of diseases classified elsewhere: Secondary | ICD-10-CM | POA: Insufficient documentation

## 2018-12-10 DIAGNOSIS — R87610 Atypical squamous cells of undetermined significance on cytologic smear of cervix (ASC-US): Secondary | ICD-10-CM | POA: Insufficient documentation

## 2018-12-19 ENCOUNTER — Telehealth: Payer: Self-pay | Admitting: Obstetrics & Gynecology

## 2018-12-19 NOTE — Telephone Encounter (Signed)
Attempted to call patient about her appointment, and how she needed to have her MyChart app downloaded in order to have this visit. A voicemail was left. °

## 2018-12-20 ENCOUNTER — Encounter: Payer: Self-pay | Admitting: Obstetrics & Gynecology

## 2018-12-20 ENCOUNTER — Telehealth: Payer: Medicaid Other | Admitting: Obstetrics & Gynecology

## 2018-12-20 ENCOUNTER — Other Ambulatory Visit: Payer: Self-pay

## 2018-12-20 NOTE — Progress Notes (Signed)
LM for pt @ 1610 stating that I am call in regards to her 1615 appt.  If she could please call the office and I will call her in 15 min in which if I do not reach her I will have to request that she reschedules her appt.   Called pt @ 1628 and LM that this is our second attempt in trying to reach her in regards to her appt if she could please call the office and reschedule her appt.

## 2018-12-20 NOTE — Progress Notes (Signed)
dnka

## 2018-12-25 ENCOUNTER — Ambulatory Visit: Payer: Medicaid Other | Admitting: Allergy and Immunology

## 2019-01-18 IMAGING — US US MFM OB FOLLOW-UP
1 series · 14 of 28 positions shown · non-contrast
Comparison: none

[Series 1: us mfm ob follow-up · 14 of 34 slices shown]
[im 2/34]
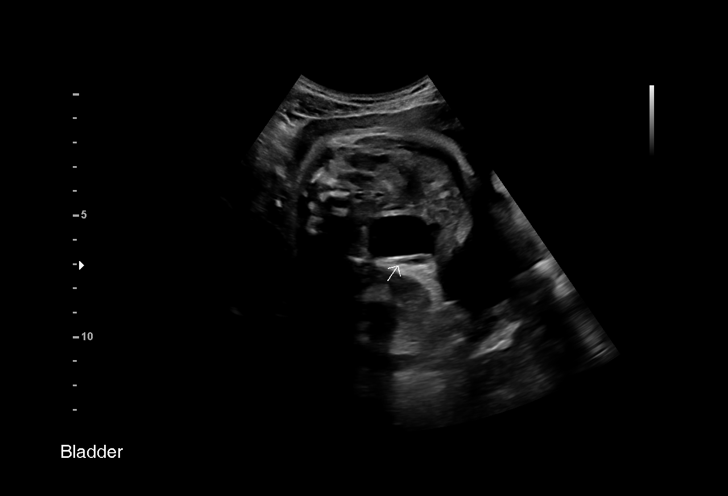
[im 4/34]
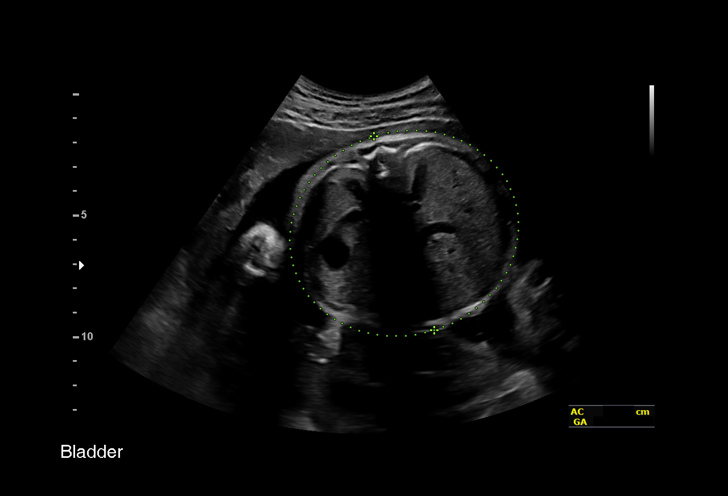
[im 7/34]
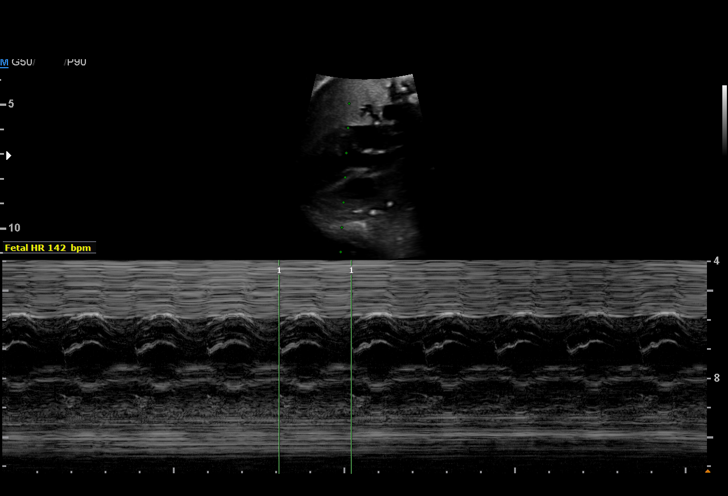
[im 9/34]
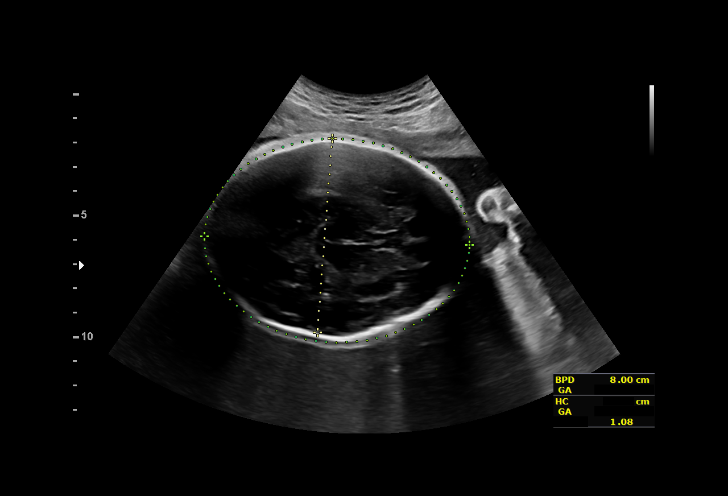
[im 12/34]
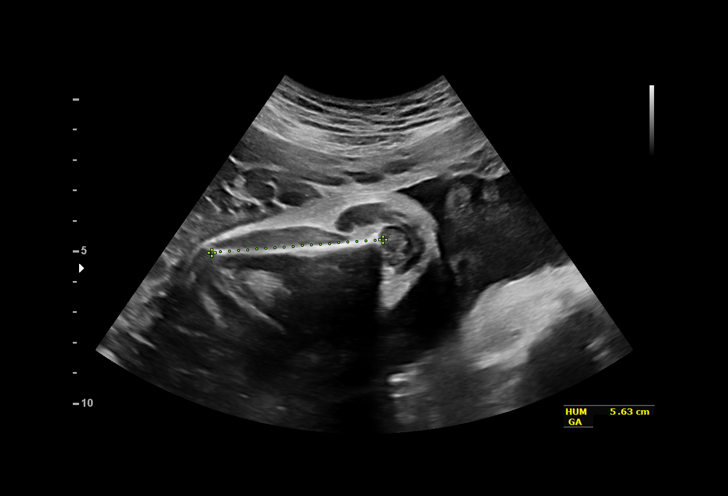
[im 14/34]
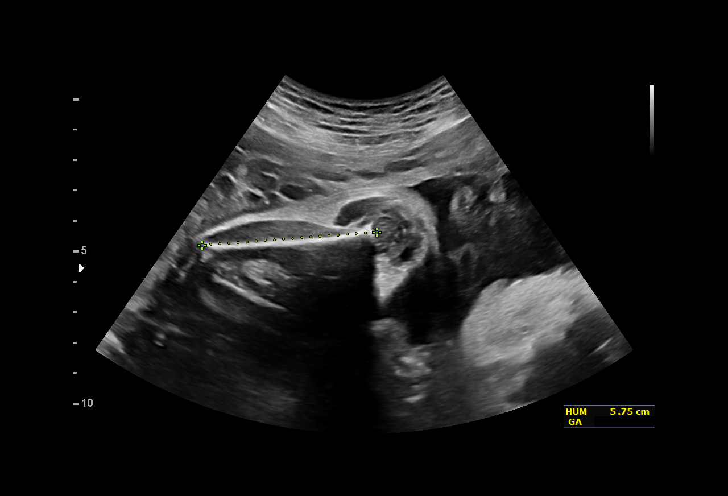
[im 16/34]
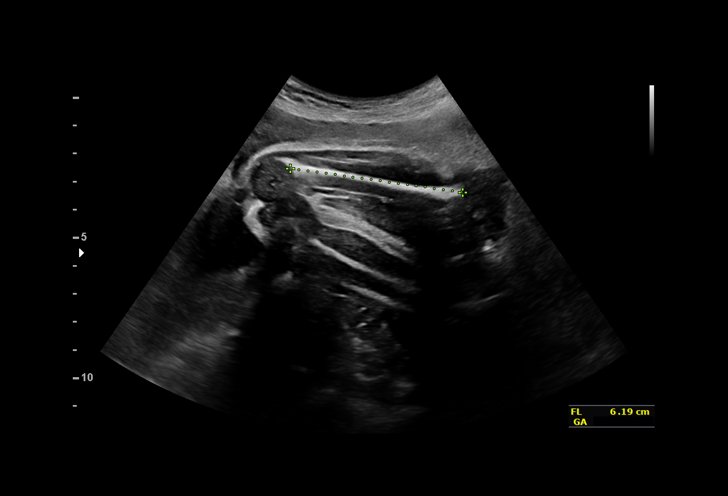
[im 19/34]
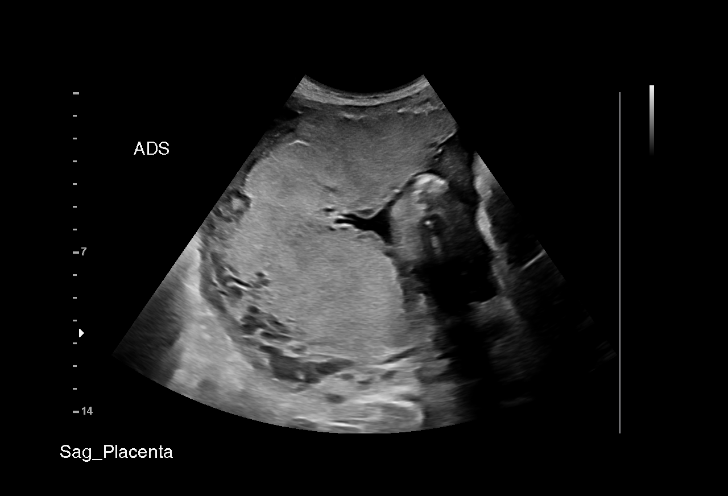
[im 21/34]
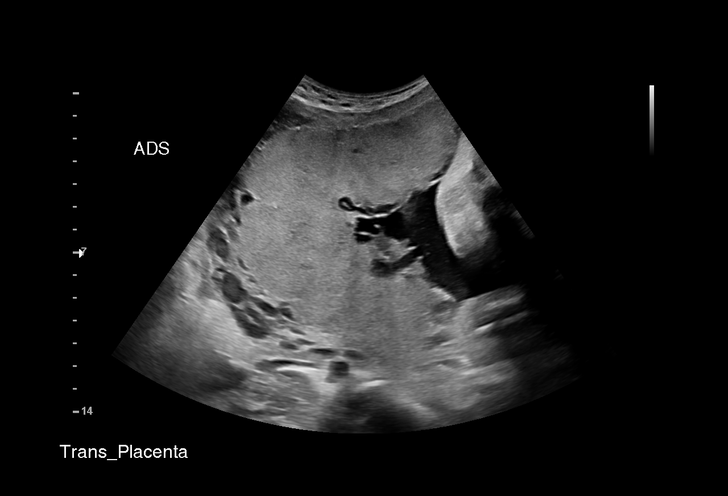
[im 24/34]
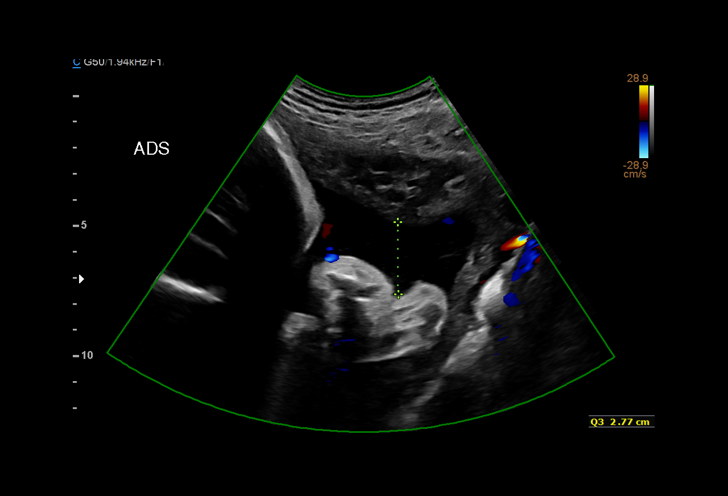
[im 26/34]
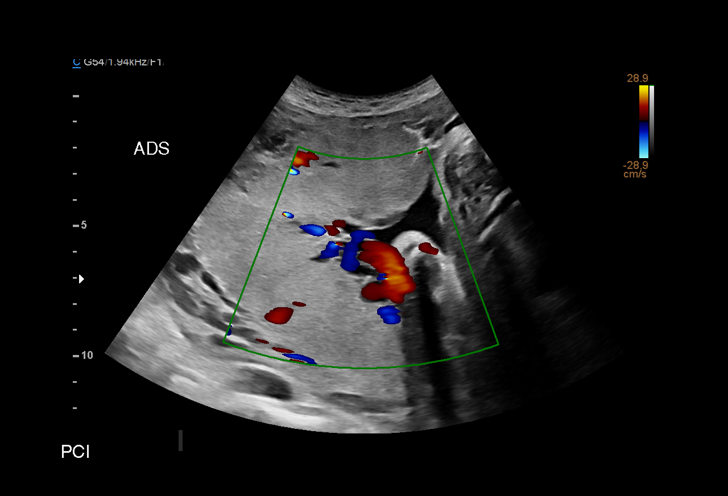
[im 29/34]
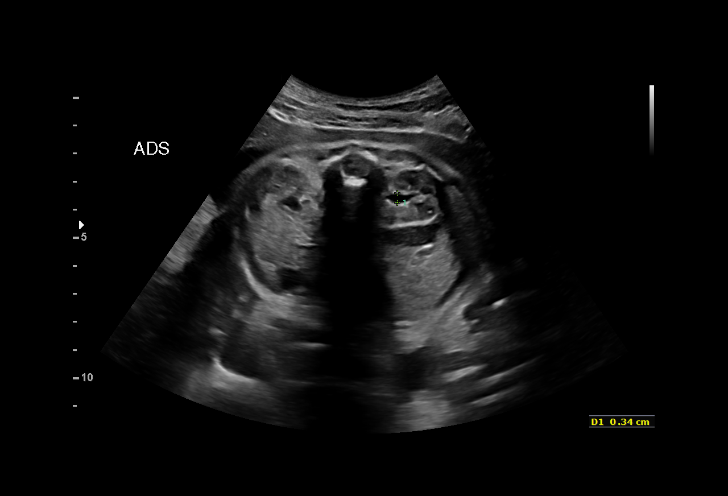
[im 31/34]
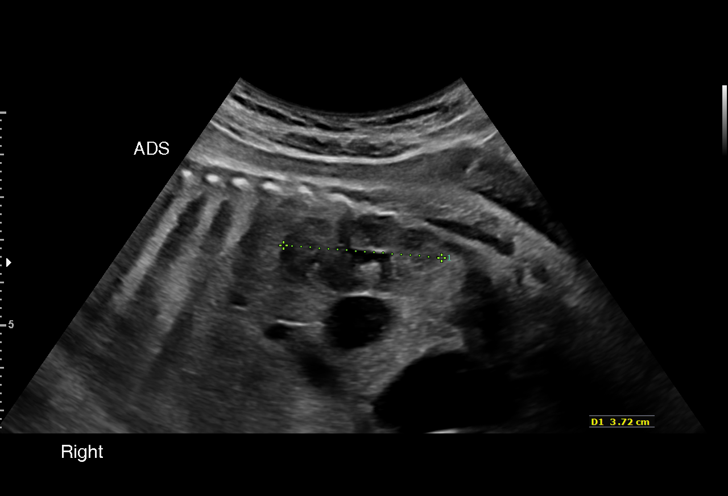
[im 34/34]
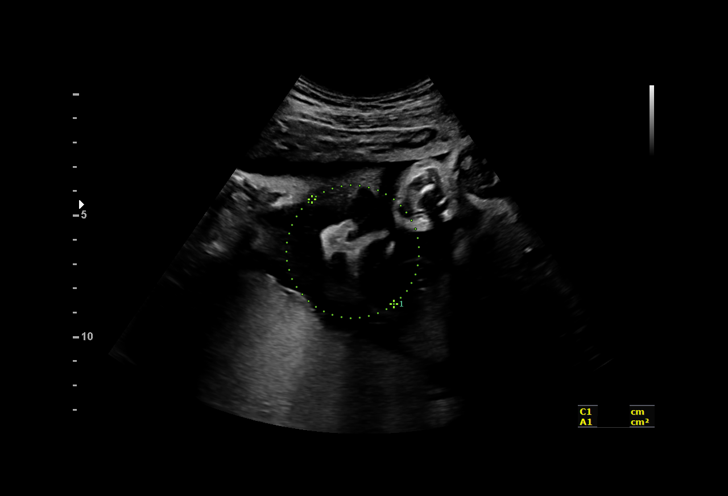

[14 of 28 positions shown; findings below may reference images not displayed]

OB/Gyn Clinic

1  [facility]            [id number]      [id number]     [id number]
Indications

33 weeks gestation of pregnancy
Small for gestational age fetus affecting
management of mother
Encounter for other antenatal screening
follow-up
OB History

Gravidity:    4         Term:   2        Prem:   0        SAB:   1
TOP:          0       Ectopic:  0        Living: 2
Fetal Evaluation

Num Of Fetuses:     1
Fetal Heart         142
Rate(bpm):
Cardiac Activity:   Observed
Presentation:       Breech
Placenta:           Posterior, above cervical os
P. Cord Insertion:  Previously Visualized

Amniotic Fluid
AFI FV:      Subjectively within normal limits

AFI Sum(cm)     %Tile       Largest Pocket(cm)
12.28           35

RUQ(cm)       RLQ(cm)       LUQ(cm)        LLQ(cm)
1.24
Biometry

BPD:      79.9  mm     G. Age:  32w 0d         19  %    CI:        69.05   %    70 - 86
FL/HC:       20.0  %    19.9 -
HC:      307.1  mm     G. Age:  34w 2d         45  %    HC/AC:       1.09       0.96 -
AC:      281.1  mm     G. Age:  32w 1d         26  %    FL/BPD:      76.8  %    71 - 87
FL:       61.4  mm     G. Age:  31w 6d         14  %    FL/AC:       21.8  %    20 - 24
HUM:      56.8  mm     G. Age:  33w 0d         59  %

Est. FW:    4057   gm     4 lb 4 oz     44  %
Gestational Age

LMP:           35w 4d        Date:  08/18/16                 EDD:   05/25/17
U/S Today:     32w 4d                                        EDD:   06/15/17
Best:          33w 0d     Det. By:  U/S  (03/02/17)          EDD:   06/12/17
Anatomy

Cranium:               Appears normal         Aortic Arch:            Previously seen
Cavum:                 Previously seen        Ductal Arch:            Previously seen
Ventricles:            Appears normal         Diaphragm:              Previously seen
Choroid Plexus:        Previously seen        Stomach:                Appears normal, left
sided
Cerebellum:            Previously seen        Abdomen:                Previously seen
Posterior Fossa:       Previously seen        Abdominal Wall:         Previously seen
Nuchal Fold:           Not applicable (>20    Cord Vessels:           Previously seen
wks GA)
Face:                  Orbits and profile     Kidneys:                Appear normal
previously seen
Lips:                  Previously seen        Bladder:                Appears normal
Thoracic:              Appears normal         Spine:                  Previously seen
Heart:                 Previously seen        Upper Extremities:      Previously seen
RVOT:                  Previously seen        Lower Extremities:      Previously seen
LVOT:                  Previously seen

Other:  Fetus appears to be a female prev seen. Heels and 5th digit prev.
visualized. Nasal bone prev. visualized.
Cervix Uterus Adnexa

Cervix
Not visualized (advanced GA >24wks)
Impression

SIUP at 33+0 weeks
Normal interval anatomy; anatomic survey complete
Normal amniotic fluid volume
Appropriate interval growth with EFW at the 44th %tile
Recommendations

Follow-up as clinically indicated

## 2019-06-09 ENCOUNTER — Other Ambulatory Visit: Payer: Self-pay | Admitting: Allergy and Immunology

## 2019-07-12 ENCOUNTER — Other Ambulatory Visit: Payer: Self-pay | Admitting: Allergy and Immunology

## 2019-07-28 ENCOUNTER — Other Ambulatory Visit: Payer: Self-pay | Admitting: Allergy and Immunology

## 2020-03-26 ENCOUNTER — Other Ambulatory Visit: Payer: Self-pay

## 2020-03-26 ENCOUNTER — Ambulatory Visit (INDEPENDENT_AMBULATORY_CARE_PROVIDER_SITE_OTHER): Payer: Self-pay

## 2020-03-26 VITALS — BP 127/77 | HR 76 | Wt 147.0 lb

## 2020-03-26 DIAGNOSIS — Z3201 Encounter for pregnancy test, result positive: Secondary | ICD-10-CM

## 2020-03-26 LAB — POCT PREGNANCY, URINE: Preg Test, Ur: POSITIVE — AB

## 2020-03-26 MED ORDER — PROMETHAZINE HCL 25 MG PO TABS: 25.0000 mg | ORAL_TABLET | Freq: Four times a day (QID) | ORAL | 0 refills | Status: DC | PRN

## 2020-03-26 NOTE — Progress Notes (Signed)
Pt here today for pregnancy test.  Resulting positive.  Pt reports LMP 02/10/20, 6w 3d today, and EDD 11/16/20.  Denies vaginal bleeding however is having mild menstrual like cramping in the lower abd.  Pt advised that if pain intensifies to go to MAU.  Medications/allergies reviewed.  Pt requests something for nausea.  Phenergan 25 mg po e-prescribed per standing protocol.  Pt advised that the front office will schedule her accordingly.  Pt verbalized understanding with no further questions.  Addison Naegeli, RN  03/26/20

## 2020-04-08 ENCOUNTER — Encounter (HOSPITAL_COMMUNITY): Payer: Self-pay | Admitting: Obstetrics and Gynecology

## 2020-04-08 ENCOUNTER — Inpatient Hospital Stay (HOSPITAL_COMMUNITY)
Admission: EM | Admit: 2020-04-08 | Discharge: 2020-04-08 | Disposition: A | Discharge status: Home / Self Care | Payer: Medicaid Other | Attending: Obstetrics and Gynecology | Admitting: Obstetrics and Gynecology

## 2020-04-08 ENCOUNTER — Other Ambulatory Visit: Payer: Self-pay

## 2020-04-08 DIAGNOSIS — R519 Headache, unspecified: Secondary | ICD-10-CM | POA: Insufficient documentation

## 2020-04-08 DIAGNOSIS — R0602 Shortness of breath: Secondary | ICD-10-CM | POA: Diagnosis not present

## 2020-04-08 DIAGNOSIS — Z79899 Other long term (current) drug therapy: Secondary | ICD-10-CM | POA: Diagnosis not present

## 2020-04-08 DIAGNOSIS — Z3A08 8 weeks gestation of pregnancy: Secondary | ICD-10-CM | POA: Diagnosis not present

## 2020-04-08 DIAGNOSIS — Z3A01 Less than 8 weeks gestation of pregnancy: Secondary | ICD-10-CM | POA: Diagnosis not present

## 2020-04-08 DIAGNOSIS — O210 Mild hyperemesis gravidarum: Secondary | ICD-10-CM | POA: Diagnosis not present

## 2020-04-08 DIAGNOSIS — Z3201 Encounter for pregnancy test, result positive: Secondary | ICD-10-CM | POA: Insufficient documentation

## 2020-04-08 DIAGNOSIS — K219 Gastro-esophageal reflux disease without esophagitis: Secondary | ICD-10-CM | POA: Diagnosis not present

## 2020-04-08 DIAGNOSIS — O26891 Other specified pregnancy related conditions, first trimester: Secondary | ICD-10-CM | POA: Diagnosis not present

## 2020-04-08 DIAGNOSIS — O219 Vomiting of pregnancy, unspecified: Secondary | ICD-10-CM | POA: Diagnosis not present

## 2020-04-08 DIAGNOSIS — O99611 Diseases of the digestive system complicating pregnancy, first trimester: Secondary | ICD-10-CM | POA: Insufficient documentation

## 2020-04-08 LAB — URINALYSIS, ROUTINE W REFLEX MICROSCOPIC
Bacteria, UA: NONE SEEN
Bilirubin Urine: NEGATIVE
Glucose, UA: NEGATIVE mg/dL
Hgb urine dipstick: NEGATIVE
Ketones, ur: NEGATIVE mg/dL
Nitrite: NEGATIVE
Protein, ur: NEGATIVE mg/dL
Specific Gravity, Urine: 1.008 (ref 1.005–1.030)
pH: 9 — ABNORMAL HIGH (ref 5.0–8.0)

## 2020-04-08 MED ORDER — OMEPRAZOLE 20 MG PO CPDR: 20.0000 mg | DELAYED_RELEASE_CAPSULE | Freq: Every day | ORAL | 1 refills | Status: DC

## 2020-04-08 MED ORDER — PROMETHAZINE HCL 25 MG/ML IJ SOLN
25.0000 mg | Freq: Once | INTRAMUSCULAR | Status: AC
  Administered 2020-04-08: 25 mg via INTRAVENOUS
  Filled 2020-04-08: qty 1

## 2020-04-08 MED ORDER — FAMOTIDINE IN NACL 20-0.9 MG/50ML-% IV SOLN
20.0000 mg | Freq: Once | INTRAVENOUS | Status: AC
  Administered 2020-04-08: 20 mg via INTRAVENOUS
  Filled 2020-04-08: qty 50

## 2020-04-08 MED ORDER — PROMETHAZINE HCL 25 MG PO TABS: 25.0000 mg | ORAL_TABLET | Freq: Four times a day (QID) | ORAL | 1 refills | Status: DC | PRN

## 2020-04-08 MED ORDER — LACTATED RINGERS IV BOLUS
1000.0000 mL | Freq: Once | INTRAVENOUS | Status: AC
  Administered 2020-04-08: 1000 mL via INTRAVENOUS

## 2020-04-08 NOTE — MAU Note (Signed)
. °  Anna [name] is a 29 y.o. at [redacted]w[redacted]d here in MAU reporting: SOB which feels more like acid reflux and nausea. Pt states she has had headaches but denies today.Denies any vaginal bleeding of abnormal discharge LMP: 02/10/2020 Onset of complaint:1 week Pain score: 0 Vitals:   04/08/20 0812 04/08/20 0905  BP: 123/77 126/88  Pulse: 79 78  Resp: 16 18  Temp: 98.6 F (37 C) 98.3 F (36.8 C)  SpO2: 98% 100%     FHT: Lab orders placed from triage: UA

## 2020-04-08 NOTE — MAU Provider Note (Signed)
History     CSN: [id number]  Arrival date and time: 04/08/20 4097   First Provider Initiated Contact with Patient 04/08/20 0919      Chief Complaint  Patient presents with  . Shortness of Breath  . Emesis   Ms. Anna [name] is a 29 y.o. year old [id number] female at [redacted]w[redacted]d weeks gestation who was sent to MAU from [facility] for nausea vomiting, shortness of breath, headache.  However the patient denies any shortness of breath or headache at this time she does report some nausea and lots of reflux x1 week.  She states she has been taking a ginger probiotic shot every day drinking ginger ale and trying to drink plenty of water, but she feels dehydrated and is requesting IV fluids.  She was taking Phenergan for her nausea which was helping her, but that prescription has run out.  She [facility]; virtual appointment scheduled for April 20, 2020, new OB visit scheduled for April 28, 2020.    OB History    Gravida  5   Para  3   Term  3   Preterm      AB  1   Living  3     SAB      TAB  1   Ectopic      Multiple  0   Live Births  3           Past Medical History:  Diagnosis Date  . Chronic headaches   . Insomnia   . Medical history non-contributory     Past Surgical History:  Procedure Laterality Date  . NO PAST SURGERIES      Family History  Problem Relation Age of Onset  . Depression Mother   . Depression Father     Social History   Tobacco Use  . Smoking status: Never Smoker  . Smokeless tobacco: Never Used  Vaping Use  . Vaping Use: Some days  . Start date: 02/06/2018  . Substances: Nicotine  . Devices: Stig   Substance Use Topics  . Alcohol use: No  . Drug use: No    Allergies:  Allergies  Allergen Reactions  . Ciprofloxacin Hcl Anaphylaxis    Medications Prior to Admission  Medication Sig Dispense Refill Last Dose  . promethazine (PHENERGAN) 25 MG  tablet Take 1 tablet (25 mg total) by mouth every 6 (six) hours as needed for nausea or vomiting. 30 tablet 0 04/08/2020 at Unknown time  . cetirizine (ZYRTEC) 10 MG tablet TAKE 1 TABLET BY MOUTH EVERY DAY 30 tablet 0   . cyproheptadine (PERIACTIN) 4 MG tablet TAKE 1 TABLET BY MOUTH AT BEDTIME 30 tablet 0   . montelukast (SINGULAIR) 10 MG tablet TAKE 1 TABLET BY MOUTH EVERYDAY AT BEDTIME 30 tablet 0   . Multiple Vitamins-Minerals (HAIR SKIN AND NAILS FORMULA PO) Take 1 tablet by mouth.     . norgestrel-ethinyl estradiol (LO/OVRAL) 0.3-30 MG-MCG tablet Take 1 tablet by mouth daily. 1 Package 11   . Olopatadine HCl (PAZEO) 0.7 % SOLN Place 1 drop into both eyes 1 day or 1 dose. 2.5 mL 5   . omeprazole (PRILOSEC) 40 MG capsule TAKE 1 CAPSULE BY MOUTH EVERY DAY 30 capsule 0     Review of Systems  Constitutional: Negative.   HENT:       Feels like mouth and eyes are dry; need IVFs  Eyes:  Negative.   Respiratory: Negative.   Gastrointestinal: Positive for nausea and vomiting.       Reflux and belching (happened with every pregnancy)  Endocrine: Negative.   Genitourinary: Negative.   Musculoskeletal: Negative.   Skin: Negative.   Allergic/Immunologic: Negative.   Neurological: Negative.   Hematological: Negative.   Psychiatric/Behavioral: Negative.    Physical Exam   Blood pressure 126/88, pulse 78, temperature 98.3 F (36.8 C), resp. rate 18, height 5\' 9"  (1.753 m), weight 65.8 kg, last menstrual period 02/10/2020, SpO2 100 %.  Physical Exam Vitals and nursing note reviewed. Exam conducted with a chaperone present.  Constitutional:      Appearance: Normal appearance. She is normal weight.  HENT:     Head: Normocephalic and atraumatic.  Cardiovascular:     Rate and Rhythm: Normal rate.     Pulses: Normal pulses.  Pulmonary:     Effort: Pulmonary effort is normal.  Abdominal:     General: Abdomen is flat.     Palpations: Abdomen is soft.  Genitourinary:    Comments: Not  indicated Musculoskeletal:        General: Normal range of motion.     Cervical back: Normal range of motion.  Skin:    General: Skin is warm and dry.  Neurological:     Mental Status: She is alert and oriented to person, place, and time.  Psychiatric:        Mood and Affect: Mood normal.        Behavior: Behavior normal.        Thought Content: Thought content normal.        Judgment: Judgment normal.     MAU Course  Procedures  MDM CCUA LR 1000 ml 999 ml bolus Phenergan 25 mg IVP Pepcid 20 mg IVPB  Results for orders placed or performed during the hospital encounter of 04/08/20 (from the past 24 hour(s))  Urinalysis, Routine w reflex microscopic Urine, Clean Catch     Status: Abnormal   Collection Time: 04/08/20  9:11 AM  Result Value Ref Range   Color, Urine YELLOW YELLOW   APPearance CLEAR CLEAR   Specific Gravity, Urine 1.008 1.005 - 1.030   pH 9.0 (H) 5.0 - 8.0   Glucose, UA NEGATIVE NEGATIVE mg/dL   Hgb urine dipstick NEGATIVE NEGATIVE   Bilirubin Urine NEGATIVE NEGATIVE   Ketones, ur NEGATIVE NEGATIVE mg/dL   Protein, ur NEGATIVE NEGATIVE mg/dL   Nitrite NEGATIVE NEGATIVE   Leukocytes,Ua TRACE (A) NEGATIVE   RBC / HPF 0-5 0 - 5 RBC/hpf   WBC, UA 0-5 0 - 5 WBC/hpf   Bacteria, UA NONE SEEN NONE SEEN   Squamous Epithelial / LPF 6-10 0 - 5   Mucus PRESENT     Assessment and Plan  Gastroesophageal reflux disease without esophagitis  - Information provided on reflux and food choices for reflux - Rx for Prilosec 20 mg daily   Morning sickness - Information provided on morning sickness  - Rx for promethazine (PHENERGAN) 25 MG tablet  - Discharge patient - Keep scheduled appts with [facility] on 11/22/ & 11/30 - Patient verbalized an understanding of the plan of care and agrees.     12/30, MSN, CNM 04/08/2020, 9:19 AM

## 2020-04-08 NOTE — ED Triage Notes (Signed)
Emergency Medicine Provider OB Triage Evaluation Note  Anna [name] is a 29 y.o. female, [phone], at approximately [redacted] weeks gestation who presents to the emergency department with complaints of nausea, NBNB emesis, shortness of breath, headache, and blurry vision for the past week. LNMP 09/13. Has not been taking anything for her symptoms. No chest pain, leg swelling, vaginal discharge/bleeding. Her last 3 pregnancies were without complications.   Review of  Systems  Positive: + HA, + blurry vision, + nausea, + vomiting, + SOB Negative: - chest pain, - leg swelling, - palpitations, - vaginal bleeding/discharge, - abdominal pain  Physical Exam  BP 123/77 (BP Location: Right Arm)   Pulse 79   Temp 98.6 F (37 C) (Oral)   Resp 16   SpO2 98%  General: Awake, no distress  HEENT: Atraumatic  Resp: Normal effort  Cardiac: Normal rate Abd: Nondistended, nontender  MSK: Moves all extremities without difficulty Neuro: Speech clear  Medical Decision Making  Pt evaluated for pregnancy concern and is stable for transfer to MAU. Pt is in agreement with plan for transfer.  8:22 AM Discussed with MAU APP, [facility] nurse midwife, who accepts patient in transfer.  Clinical Impression  No diagnosis found.     Tanda Rockers, PA-C 04/08/20 [phone]

## 2020-04-08 NOTE — Discharge Instructions (Signed)

## 2020-04-21 ENCOUNTER — Other Ambulatory Visit: Payer: Self-pay

## 2020-04-21 ENCOUNTER — Ambulatory Visit (INDEPENDENT_AMBULATORY_CARE_PROVIDER_SITE_OTHER): Payer: Medicaid Other | Admitting: General Practice

## 2020-04-21 VITALS — BP 97/78 | HR 83 | Wt 142.0 lb

## 2020-04-21 DIAGNOSIS — Z3490 Encounter for supervision of normal pregnancy, unspecified, unspecified trimester: Secondary | ICD-10-CM | POA: Insufficient documentation

## 2020-04-21 DIAGNOSIS — O219 Vomiting of pregnancy, unspecified: Secondary | ICD-10-CM

## 2020-04-21 DIAGNOSIS — O0990 Supervision of high risk pregnancy, unspecified, unspecified trimester: Secondary | ICD-10-CM | POA: Insufficient documentation

## 2020-04-21 DIAGNOSIS — Z3491 Encounter for supervision of normal pregnancy, unspecified, first trimester: Secondary | ICD-10-CM

## 2020-04-21 DIAGNOSIS — Z8751 Personal history of pre-term labor: Secondary | ICD-10-CM | POA: Insufficient documentation

## 2020-04-21 MED ORDER — DICLEGIS 10-10 MG PO TBEC: DELAYED_RELEASE_TABLET | ORAL | 3 refills | Status: DC

## 2020-04-21 MED ORDER — BLOOD PRESSURE KIT: 1.0000 | PACK | Freq: Once | 0 refills | Status: AC

## 2020-04-21 NOTE — Progress Notes (Signed)
New OB Intake  Patient presents in person to office today. I explained I am completing New OB Intake today. We discussed her EDD of June 20th, 2022 that is based on LMP of September 13th. Pt is G5/P3. I reviewed her allergies, medications, Medical/Surgical/OB history, and appropriate screenings. Based on history, this is a high risk pregnancy complicated by history of preterm delivery. Patient has not been on Makena injections in the past.   Concerns addressed today: -Patient continues to struggle with nausea. She is currently taking Prilosec & Phenergan. Phenergan does provide some relief but causes too much drowsiness to take during the day. Diclegis Rx ordered per protocol. Also discussed her trying Sea Bands or Vitamin B6. Will follow up at initial prenatal visit to monitor improvement.  MyChart/Babyscripts MyChart access verified. Babyscripts instructions given. Ordered placed in Epic so patient can complete registration.   Blood Pressure Cuff Blood pressure cuff ordered for patient to pick-up from [employer]. Explained after first prenatal appt pt will check weekly and document in Babyscripts. Advised she bring cuff to initial prenatal appt to verify accuracy.   Anatomy US Explained first scheduled [place] will be around 19 weeks. Anatomy US scheduled for January 26 at 10:00 am.  Labs Discussed Avelina Laine genetic screening with patient. Would like both Panorama and Horizon drawn at new OB visit. Routine prenatal labs needed as well as urine culture, A1C, & pap smear with GC/CH testing. 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 Offer flu vaccine  First visit review I reviewed new OB appt with pt. I explained she will have a pelvic exam, ob bloodwork with genetic screening, and PAP smear. Explained pt will be seen by Dr Crissie Reese at first visit; encounter routed to appropriate provider.  Marylynn Pearson, RN 04/21/2020  10:53 AM

## 2020-04-22 NOTE — Progress Notes (Signed)
Chart reviewed for nurse visit. Agree with plan of care.   Venora Maples, MD 04/22/20 11:20 AM

## 2020-04-28 ENCOUNTER — Other Ambulatory Visit (HOSPITAL_COMMUNITY)
Admission: RE | Admit: 2020-04-28 | Discharge: 2020-04-28 | Disposition: A | Discharge status: Home / Self Care | Payer: Medicaid Other | Source: Ambulatory Visit | Attending: Family Medicine | Admitting: Family Medicine

## 2020-04-28 ENCOUNTER — Other Ambulatory Visit: Payer: Self-pay

## 2020-04-28 ENCOUNTER — Ambulatory Visit (INDEPENDENT_AMBULATORY_CARE_PROVIDER_SITE_OTHER): Payer: Medicaid Other | Admitting: Family Medicine

## 2020-04-28 ENCOUNTER — Encounter: Payer: Self-pay | Admitting: Family Medicine

## 2020-04-28 VITALS — BP 127/87 | HR 75 | Wt 144.7 lb

## 2020-04-28 DIAGNOSIS — Z658 Other specified problems related to psychosocial circumstances: Secondary | ICD-10-CM

## 2020-04-28 DIAGNOSIS — Z3491 Encounter for supervision of normal pregnancy, unspecified, first trimester: Secondary | ICD-10-CM | POA: Insufficient documentation

## 2020-04-28 DIAGNOSIS — Z8751 Personal history of pre-term labor: Secondary | ICD-10-CM

## 2020-04-28 DIAGNOSIS — Z3481 Encounter for supervision of other normal pregnancy, first trimester: Secondary | ICD-10-CM | POA: Diagnosis not present

## 2020-04-28 DIAGNOSIS — B977 Papillomavirus as the cause of diseases classified elsewhere: Secondary | ICD-10-CM

## 2020-04-28 LAB — POCT URINALYSIS DIP (DEVICE)
Bilirubin Urine: NEGATIVE
Glucose, UA: NEGATIVE mg/dL
Hgb urine dipstick: NEGATIVE
Ketones, ur: NEGATIVE mg/dL
Leukocytes,Ua: NEGATIVE
Nitrite: NEGATIVE
Protein, ur: NEGATIVE mg/dL
Specific Gravity, Urine: 1.025 (ref 1.005–1.030)
Urobilinogen, UA: 0.2 mg/dL (ref 0.0–1.0)
pH: 7 (ref 5.0–8.0)

## 2020-04-28 NOTE — Addendum Note (Signed)
Addended by: Maxwell Marion E on: 04/28/2020 02:20 PM   Modules accepted: Orders

## 2020-04-28 NOTE — Patient Instructions (Signed)
 First Trimester of Pregnancy The first trimester of pregnancy is from week 1 until the end of week 13 (months 1 through 3). A week after a sperm fertilizes an egg, the egg will implant on the wall of the uterus. This embryo will begin to develop into a baby. Genes from you and your partner will form the baby. The female genes will determine whether the baby will be a boy or a girl. At 6-8 weeks, the eyes and face will be formed, and the heartbeat can be seen on ultrasound. At the end of 12 weeks, all the baby's organs will be formed. Now that you are pregnant, you will want to do everything you can to have a healthy baby. Two of the most important things are to get good prenatal care and to follow your health care provider's instructions. Prenatal care is all the medical care you receive before the baby's birth. This care will help prevent, find, and treat any problems during the pregnancy and childbirth. Body changes during your first trimester Your body goes through many changes during pregnancy. The changes vary from woman to woman.  You may gain or lose a couple of pounds at first.  You may feel sick to your stomach (nauseous) and you may throw up (vomit). If the vomiting is uncontrollable, call your health care provider.  You may tire easily.  You may develop headaches that can be relieved by medicines. All medicines should be approved by your health care provider.  You may urinate more often. Painful urination may mean you have a bladder infection.  You may develop heartburn as a result of your pregnancy.  You may develop constipation because certain hormones are causing the muscles that push stool through your intestines to slow down.  You may develop hemorrhoids or swollen veins (varicose veins).  Your breasts may begin to grow larger and become tender. Your nipples may stick out more, and the tissue that surrounds them (areola) may become darker.  Your gums may bleed and may be  sensitive to brushing and flossing.  Dark spots or blotches (chloasma, mask of pregnancy) may develop on your face. This will likely fade after the baby is born.  Your menstrual periods will stop.  You may have a loss of appetite.  You may develop cravings for certain kinds of food.  You may have changes in your emotions from day to day, such as being excited to be pregnant or being concerned that something may go wrong with the pregnancy and baby.  You may have more vivid and strange dreams.  You may have changes in your hair. These can include thickening of your hair, rapid growth, and changes in texture. Some women also have hair loss during or after pregnancy, or hair that feels dry or thin. Your hair will most likely return to normal after your baby is born. What to expect at prenatal visits During a routine prenatal visit:  You will be weighed to make sure you and the baby are growing normally.  Your blood pressure will be taken.  Your abdomen will be measured to track your baby's growth.  The fetal heartbeat will be listened to between weeks 10 and 14 of your pregnancy.  Test results from any previous visits will be discussed. Your health care provider may ask you:  How you are feeling.  If you are feeling the baby move.  If you have had any abnormal symptoms, such as leaking fluid, bleeding, severe headaches, or   abdominal cramping.  If you are using any tobacco products, including cigarettes, chewing tobacco, and electronic cigarettes.  If you have any questions. Other tests that may be performed during your first trimester include:  Blood tests to find your blood type and to check for the presence of any previous infections. The tests will also be used to check for low iron levels (anemia) and protein on red blood cells (Rh antibodies). Depending on your risk factors, or if you previously had diabetes during pregnancy, you may have tests to check for high blood sugar  that affects pregnant women (gestational diabetes).  Urine tests to check for infections, diabetes, or protein in the urine.  An ultrasound to confirm the proper growth and development of the baby.  Fetal screens for spinal cord problems (spina bifida) and Down syndrome.  HIV (human immunodeficiency virus) testing. Routine prenatal testing includes screening for HIV, unless you choose not to have this test.  You may need other tests to make sure you and the baby are doing well. Follow these instructions at home: Medicines  Follow your health care provider's instructions regarding medicine use. Specific medicines may be either safe or unsafe to take during pregnancy.  Take a prenatal vitamin that contains at least 600 micrograms (mcg) of folic acid.  If you develop constipation, try taking a stool softener if your health care provider approves. Eating and drinking   Eat a balanced diet that includes fresh fruits and vegetables, whole grains, good sources of protein such as meat, eggs, or tofu, and low-fat dairy. Your health care provider will help you determine the amount of weight gain that is right for you.  Avoid raw meat and uncooked cheese. These carry germs that can cause birth defects in the baby.  Eating four or five small meals rather than three large meals a day may help relieve nausea and vomiting. If you start to feel nauseous, eating a few soda crackers can be helpful. Drinking liquids between meals, instead of during meals, also seems to help ease nausea and vomiting.  Limit foods that are high in fat and processed sugars, such as fried and sweet foods.  To prevent constipation: ? Eat foods that are high in fiber, such as fresh fruits and vegetables, whole grains, and beans. ? Drink enough fluid to keep your urine clear or pale yellow. Activity  Exercise only as directed by your health care provider. Most women can continue their usual exercise routine during  pregnancy. Try to exercise for 30 minutes at least 5 days a week. Exercising will help you: ? Control your weight. ? Stay in shape. ? Be prepared for labor and delivery.  Experiencing pain or cramping in the lower abdomen or lower back is a good sign that you should stop exercising. Check with your health care provider before continuing with normal exercises.  Try to avoid standing for long periods of time. Move your legs often if you must stand in one place for a long time.  Avoid heavy lifting.  Wear low-heeled shoes and practice good posture.  You may continue to have sex unless your health care provider tells you not to. Relieving pain and discomfort  Wear a good support bra to relieve breast tenderness.  Take warm sitz baths to soothe any pain or discomfort caused by hemorrhoids. Use hemorrhoid cream if your health care provider approves.  Rest with your legs elevated if you have leg cramps or low back pain.  If you develop varicose veins   in your legs, wear support hose. Elevate your feet for 15 minutes, 3-4 times a day. Limit salt in your diet. Prenatal care  Schedule your prenatal visits by the twelfth week of pregnancy. They are usually scheduled monthly at first, then more often in the last 2 months before delivery.  Write down your questions. Take them to your prenatal visits.  Keep all your prenatal visits as told by your health care provider. This is important. Safety  Wear your seat belt at all times when driving.  Make a list of emergency phone numbers, including numbers for family, friends, the hospital, and police and fire departments. General instructions  Ask your health care provider for a referral to a local prenatal education class. Begin classes no later than the beginning of month 6 of your pregnancy.  Ask for help if you have counseling or nutritional needs during pregnancy. Your health care provider can offer advice or refer you to specialists for help  with various needs.  Do not use hot tubs, steam rooms, or saunas.  Do not douche or use tampons or scented sanitary pads.  Do not cross your legs for long periods of time.  Avoid cat litter boxes and soil used by cats. These carry germs that can cause birth defects in the baby and possibly loss of the fetus by miscarriage or stillbirth.  Avoid all smoking, herbs, alcohol, and medicines not prescribed by your health care provider. Chemicals in these products affect the formation and growth of the baby.  Do not use any products that contain nicotine or tobacco, such as cigarettes and e-cigarettes. If you need help quitting, ask your health care provider. You may receive counseling support and other resources to help you quit.  Schedule a dentist appointment. At home, brush your teeth with a soft toothbrush and be gentle when you floss. Contact a health care provider if:  You have dizziness.  You have mild pelvic cramps, pelvic pressure, or nagging pain in the abdominal area.  You have persistent nausea, vomiting, or diarrhea.  You have a bad smelling vaginal discharge.  You have pain when you urinate.  You notice increased swelling in your face, hands, legs, or ankles.  You are exposed to fifth disease or chickenpox.  You are exposed to [place] measles (rubella) and have never had it. Get help right away if:  You have a fever.  You are leaking fluid from your vagina.  You have spotting or bleeding from your vagina.  You have severe abdominal cramping or pain.  You have rapid weight gain or loss.  You vomit blood or material that looks like coffee grounds.  You develop a severe headache.  You have shortness of breath.  You have any kind of trauma, such as from a fall or a car accident. Summary  The first trimester of pregnancy is from week 1 until the end of week 13 (months 1 through 3).  Your body goes through many changes during pregnancy. The changes vary from  woman to woman.  You will have routine prenatal visits. During those visits, your health care provider will examine you, discuss any test results you may have, and talk with you about how you are feeling. This information is not intended to replace advice given to you by your health care provider. Make sure you discuss any questions you have with your health care provider. Document Revised: 04/28/2017 Document Reviewed: 04/27/2016 Elsevier Patient Education  2020 [employer].   Contraception Choices Contraception,  also called birth control, refers to methods or devices that prevent pregnancy. Hormonal methods Contraceptive implant  A contraceptive implant is a thin, plastic tube that contains a hormone. It is inserted into the upper part of the arm. It can remain in place for up to 3 years. Progestin-only injections Progestin-only injections are injections of progestin, a synthetic form of the hormone progesterone. They are given every 3 months by a health care provider. Birth control pills  Birth control pills are pills that contain hormones that prevent pregnancy. They must be taken once a day, preferably at the same time each day. Birth control patch  The birth control patch contains hormones that prevent pregnancy. It is placed on the skin and must be changed once a week for three weeks and removed on the fourth week. A prescription is needed to use this method of contraception. Vaginal ring  A vaginal ring contains hormones that prevent pregnancy. It is placed in the vagina for three weeks and removed on the fourth week. After that, the process is repeated with a new ring. A prescription is needed to use this method of contraception. Emergency contraceptive Emergency contraceptives prevent pregnancy after unprotected sex. They come in pill form and can be taken up to 5 days after sex. They work best the sooner they are taken after having sex. Most emergency contraceptives are  available without a prescription. This method should not be used as your only form of birth control. Barrier methods Female condom  A female condom is a thin sheath that is worn over the penis during sex. Condoms keep sperm from going inside a woman's body. They can be used with a spermicide to increase their effectiveness. They should be disposed after a single use. Female condom  A female condom is a soft, loose-fitting sheath that is put into the vagina before sex. The condom keeps sperm from going inside a woman's body. They should be disposed after a single use. Diaphragm  A diaphragm is a soft, dome-shaped barrier. It is inserted into the vagina before sex, along with a spermicide. The diaphragm blocks sperm from entering the uterus, and the spermicide kills sperm. A diaphragm should be left in the vagina for 6-8 hours after sex and removed within 24 hours. A diaphragm is prescribed and fitted by a health care provider. A diaphragm should be replaced every 1-2 years, after giving birth, after gaining more than 15 lb (6.8 kg), and after pelvic surgery. Cervical cap  A cervical cap is a round, soft latex or plastic cup that fits over the cervix. It is inserted into the vagina before sex, along with spermicide. It blocks sperm from entering the uterus. The cap should be left in place for 6-8 hours after sex and removed within 48 hours. A cervical cap must be prescribed and fitted by a health care provider. It should be replaced every 2 years. Sponge  A sponge is a soft, circular piece of polyurethane foam with spermicide on it. The sponge helps block sperm from entering the uterus, and the spermicide kills sperm. To use it, you make it wet and then insert it into the vagina. It should be inserted before sex, left in for at least 6 hours after sex, and removed and thrown away within 30 hours. Spermicides Spermicides are chemicals that kill or block sperm from entering the cervix and uterus. They can  come as a cream, jelly, suppository, foam, or tablet. A spermicide should be inserted into the vagina with  an applicator at least 10-15 minutes before sex to allow time for it to work. The process must be repeated every time you have sex. Spermicides do not require a prescription. Intrauterine contraception Intrauterine device (IUD) An IUD is a T-shaped device that is put in a woman's uterus. There are two types:  Hormone IUD.This type contains progestin, a synthetic form of the hormone progesterone. This type can stay in place for 3-5 years.  Copper IUD.This type is wrapped in copper wire. It can stay in place for 10 years.  Permanent methods of contraception Female tubal ligation In this method, a woman's fallopian tubes are sealed, tied, or blocked during surgery to prevent eggs from traveling to the uterus. Hysteroscopic sterilization In this method, a small, flexible insert is placed into each fallopian tube. The inserts cause scar tissue to form in the fallopian tubes and block them, so sperm cannot reach an egg. The procedure takes about 3 months to be effective. Another form of birth control must be used during those 3 months. Female sterilization This is a procedure to tie off the tubes that carry sperm (vasectomy). After the procedure, the man can still ejaculate fluid (semen). Natural planning methods Natural family planning In this method, a couple does not have sex on days when the woman could become pregnant. Calendar method This means keeping track of the length of each menstrual cycle, identifying the days when pregnancy can happen, and not having sex on those days. Ovulation method In this method, a couple avoids sex during ovulation. Symptothermal method This method involves not having sex during ovulation. The woman typically checks for ovulation by watching changes in her temperature and in the consistency of cervical mucus. Post-ovulation method In this method, a couple  waits to have sex until after ovulation. Summary  Contraception, also called birth control, means methods or devices that prevent pregnancy.  Hormonal methods of contraception include implants, injections, pills, patches, vaginal rings, and emergency contraceptives.  Barrier methods of contraception can include female condoms, female condoms, diaphragms, cervical caps, sponges, and spermicides.  There are two types of IUDs (intrauterine devices). An IUD can be put in a woman's uterus to prevent pregnancy for 3-5 years.  Permanent sterilization can be done through a procedure for males, females, or both.  Natural family planning methods involve not having sex on days when the woman could become pregnant. This information is not intended to replace advice given to you by your health care provider. Make sure you discuss any questions you have with your health care provider. Document Revised: 05/18/2017 Document Reviewed: 06/18/2016 Elsevier Patient Education  2020 [employer].   Breastfeeding  Choosing to breastfeed is one of the best decisions you can make for yourself and your baby. A change in hormones during pregnancy causes your breasts to make breast milk in your milk-producing glands. Hormones prevent breast milk from being released before your baby is born. They also prompt milk flow after birth. Once breastfeeding has begun, thoughts of your baby, as well as his or her sucking or crying, can stimulate the release of milk from your milk-producing glands. Benefits of breastfeeding Research shows that breastfeeding offers many health benefits for infants and mothers. It also offers a cost-free and convenient way to feed your baby. For your baby  Your first milk (colostrum) helps your baby's digestive system to function better.  Special cells in your milk (antibodies) help your baby to fight off infections.  Breastfed babies are less likely to develop  asthma, allergies, obesity, or  type 2 diabetes. They are also at lower risk for sudden infant death syndrome (SIDS).  Nutrients in breast milk are better able to meet your baby's needs compared to infant formula.  Breast milk improves your baby's brain development. For you  Breastfeeding helps to create a very special bond between you and your baby.  Breastfeeding is convenient. Breast milk costs nothing and is always available at the correct temperature.  Breastfeeding helps to burn calories. It helps you to lose the weight that you gained during pregnancy.  Breastfeeding makes your uterus return faster to its size before pregnancy. It also slows bleeding (lochia) after you give birth.  Breastfeeding helps to lower your risk of developing type 2 diabetes, osteoporosis, rheumatoid arthritis, cardiovascular disease, and breast, ovarian, uterine, and endometrial cancer later in life. Breastfeeding basics Starting breastfeeding  Find a comfortable place to sit or lie down, with your neck and back well-supported.  Place a pillow or a rolled-up blanket under your baby to bring him or her to the level of your breast (if you are seated). Nursing pillows are specially designed to help support your arms and your baby while you breastfeed.  Make sure that your baby's tummy (abdomen) is facing your abdomen.  Gently massage your breast. With your fingertips, massage from the outer edges of your breast inward toward the nipple. This encourages milk flow. If your milk flows slowly, you may need to continue this action during the feeding.  Support your breast with 4 fingers underneath and your thumb above your nipple (make the letter "C" with your hand). Make sure your fingers are well away from your nipple and your baby's mouth.  Stroke your baby's lips gently with your finger or nipple.  When your baby's mouth is open wide enough, quickly bring your baby to your breast, placing your entire nipple and as much of the areola as  possible into your baby's mouth. The areola is the colored area around your nipple. ? More areola should be visible above your baby's upper lip than below the lower lip. ? Your baby's lips should be opened and extended outward (flanged) to ensure an adequate, comfortable latch. ? Your baby's tongue should be between his or her lower gum and your breast.  Make sure that your baby's mouth is correctly positioned around your nipple (latched). Your baby's lips should create a seal on your breast and be turned out (everted).  It is common for your baby to suck about 2-3 minutes in order to start the flow of breast milk. Latching Teaching your baby how to latch onto your breast properly is very important. An improper latch can cause nipple pain, decreased milk supply, and poor weight gain in your baby. Also, if your baby is not latched onto your nipple properly, he or she may swallow some air during feeding. This can make your baby fussy. Burping your baby when you switch breasts during the feeding can help to get rid of the air. However, teaching your baby to latch on properly is still the best way to prevent fussiness from swallowing air while breastfeeding. Signs that your baby has successfully latched onto your nipple  Silent tugging or silent sucking, without causing you pain. Infant's lips should be extended outward (flanged).  Swallowing heard between every 3-4 sucks once your milk has started to flow (after your let-down milk reflex occurs).  Muscle movement above and in front of his or her ears while sucking. Signs  that your baby has not successfully latched onto your nipple  Sucking sounds or smacking sounds from your baby while breastfeeding.  Nipple pain. If you think your baby has not latched on correctly, slip your finger into the corner of your baby's mouth to break the suction and place it between your baby's gums. Attempt to start breastfeeding again. Signs of successful  breastfeeding Signs from your baby  Your baby will gradually decrease the number of sucks or will completely stop sucking.  Your baby will fall asleep.  Your baby's body will relax.  Your baby will retain a small amount of milk in his or her mouth.  Your baby will let go of your breast by himself or herself. Signs from you  Breasts that have increased in firmness, weight, and size 1-3 hours after feeding.  Breasts that are softer immediately after breastfeeding.  Increased milk volume, as well as a change in milk consistency and color by the fifth day of breastfeeding.  Nipples that are not sore, cracked, or bleeding. Signs that your baby is getting enough milk  Wetting at least 1-2 diapers during the first 24 hours after birth.  Wetting at least 5-6 diapers every 24 hours for the first week after birth. The urine should be clear or pale yellow by the age of 5 days.  Wetting 6-8 diapers every 24 hours as your baby continues to grow and develop.  At least 3 stools in a 24-hour period by the age of 5 days. The stool should be soft and yellow.  At least 3 stools in a 24-hour period by the age of 7 days. The stool should be seedy and yellow.  No loss of weight greater than 10% of birth weight during the first 3 days of life.  Average weight gain of 4-7 oz (113-198 g) per week after the age of 4 days.  Consistent daily weight gain by the age of 5 days, without weight loss after the age of 2 weeks. After a feeding, your baby may spit up a small amount of milk. This is normal. Breastfeeding frequency and duration Frequent feeding will help you make more milk and can prevent sore nipples and extremely full breasts (breast engorgement). Breastfeed when you feel the need to reduce the fullness of your breasts or when your baby shows signs of hunger. This is called "breastfeeding on demand." Signs that your baby is hungry include:  Increased alertness, activity, or  restlessness.  Movement of the head from side to side.  Opening of the mouth when the corner of the mouth or cheek is stroked (rooting).  Increased sucking sounds, smacking lips, cooing, sighing, or squeaking.  Hand-to-mouth movements and sucking on fingers or hands.  Fussing or crying. Avoid introducing a pacifier to your baby in the first 4-6 weeks after your baby is born. After this time, you may choose to use a pacifier. Research has shown that pacifier use during the first year of a baby's life decreases the risk of sudden infant death syndrome (SIDS). Allow your baby to feed on each breast as long as he or she wants. When your baby unlatches or falls asleep while feeding from the first breast, offer the second breast. Because newborns are often sleepy in the first few weeks of life, you may need to awaken your baby to get him or her to feed. Breastfeeding times will vary from baby to baby. However, the following rules can serve as a guide to help you make sure  that your baby is properly fed:  Newborns (babies 30 weeks of age or younger) may breastfeed every 1-3 hours.  Newborns should not go without breastfeeding for longer than 3 hours during the day or 5 hours during the night.  You should breastfeed your baby a minimum of 8 times in a 24-hour period. Breast milk pumping     Pumping and storing breast milk allows you to make sure that your baby is exclusively fed your breast milk, even at times when you are unable to breastfeed. This is especially important if you go back to work while you are still breastfeeding, or if you are not able to be present during feedings. Your lactation consultant can help you find a method of pumping that works best for you and give you guidelines about how long it is safe to store breast milk. Caring for your breasts while you breastfeed Nipples can become dry, cracked, and sore while breastfeeding. The following recommendations can help keep your  breasts moisturized and healthy:  Avoid using soap on your nipples.  Wear a supportive bra designed especially for nursing. Avoid wearing underwire-style bras or extremely tight bras (sports bras).  Air-dry your nipples for 3-4 minutes after each feeding.  Use only cotton bra pads to absorb leaked breast milk. Leaking of breast milk between feedings is normal.  Use lanolin on your nipples after breastfeeding. Lanolin helps to maintain your skin's normal moisture barrier. Pure lanolin is not harmful (not toxic) to your baby. You may also hand express a few drops of breast milk and gently massage that milk into your nipples and allow the milk to air-dry. In the first few weeks after giving birth, some women experience breast engorgement. Engorgement can make your breasts feel heavy, warm, and tender to the touch. Engorgement peaks within 3-5 days after you give birth. The following recommendations can help to ease engorgement:  Completely empty your breasts while breastfeeding or pumping. You may want to start by applying warm, moist heat (in the shower or with warm, water-soaked hand towels) just before feeding or pumping. This increases circulation and helps the milk flow. If your baby does not completely empty your breasts while breastfeeding, pump any extra milk after he or she is finished.  Apply ice packs to your breasts immediately after breastfeeding or pumping, unless this is too uncomfortable for you. To do this: ? Put ice in a plastic bag. ? Place a towel between your skin and the bag. ? Leave the ice on for 20 minutes, 2-3 times a day.  Make sure that your baby is latched on and positioned properly while breastfeeding. If engorgement persists after 48 hours of following these recommendations, contact your health care provider or a Advertising copywriter. Overall health care recommendations while breastfeeding  Eat 3 healthy meals and 3 snacks every day. Well-nourished mothers who are  breastfeeding need an additional 450-500 calories a day. You can meet this requirement by increasing the amount of a balanced diet that you eat.  Drink enough water to keep your urine pale yellow or clear.  Rest often, relax, and continue to take your prenatal vitamins to prevent fatigue, stress, and low vitamin and mineral levels in your body (nutrient deficiencies).  Do not use any products that contain nicotine or tobacco, such as cigarettes and e-cigarettes. Your baby may be harmed by chemicals from cigarettes that pass into breast milk and exposure to secondhand smoke. If you need help quitting, ask your health care provider.  Avoid alcohol.  Do not use illegal drugs or marijuana.  Talk with your health care provider before taking any medicines. These include over-the-counter and prescription medicines as well as vitamins and herbal supplements. Some medicines that may be harmful to your baby can pass through breast milk.  It is possible to become pregnant while breastfeeding. If birth control is desired, ask your health care provider about options that will be safe while breastfeeding your baby. Where to find more information: [employer]: www.llli.org Contact a health care provider if:  You feel like you want to stop breastfeeding or have become frustrated with breastfeeding.  Your nipples are cracked or bleeding.  Your breasts are red, tender, or warm.  You have: ? Painful breasts or nipples. ? A swollen area on either breast. ? A fever or chills. ? Nausea or vomiting. ? Drainage other than breast milk from your nipples.  Your breasts do not become full before feedings by the fifth day after you give birth.  You feel sad and depressed.  Your baby is: ? Too sleepy to eat well. ? Having trouble sleeping. ? More than 1 week old and wetting fewer than 6 diapers in a 24-hour period. ? Not gaining weight by 5 days of age.  Your baby has fewer than 3  stools in a 24-hour period.  Your baby's skin or the white parts of his or her eyes become yellow. Get help right away if:  Your baby is overly tired (lethargic) and does not want to wake up and feed.  Your baby develops an unexplained fever. Summary  Breastfeeding offers many health benefits for infant and mothers.  Try to breastfeed your infant when he or she shows early signs of hunger.  Gently tickle or stroke your baby's lips with your finger or nipple to allow the baby to open his or her mouth. Bring the baby to your breast. Make sure that much of the areola is in your baby's mouth. Offer one side and burp the baby before you offer the other side.  Talk with your health care provider or lactation consultant if you have questions or you face problems as you breastfeed. This information is not intended to replace advice given to you by your health care provider. Make sure you discuss any questions you have with your health care provider. Document Revised: 08/10/2017 Document Reviewed: 06/17/2016 Elsevier Patient Education  2020 Elsevier Inc.  

## 2020-04-28 NOTE — Progress Notes (Signed)
   Subjective:  Anna [name] is a 29 y.o. G5P3013 at [redacted]w[redacted]d being seen today for ongoing prenatal care.  She is currently monitored for the following issues for this high-risk pregnancy and has HPV in female; Supervision of low-risk pregnancy; and History of preterm delivery on their problem list.  Patient reports no complaints.  Contractions: Not present. Vag. Bleeding: None.  Movement: Absent. Denies leaking of fluid.   The following portions of the patient's history were reviewed and updated as appropriate: allergies, current medications, past family history, past medical history, past social history, past surgical history and problem list. Problem list updated.  Objective:   Vitals:   04/28/20 1021  BP: 127/87  Pulse: 75  Weight: 144 lb 11.2 oz (65.6 kg)    Fetal Status: Fetal Heart Rate (bpm): 156   Movement: Absent     General:  Alert, oriented and cooperative. Patient is in no acute distress.  Skin: Skin is warm and dry. No rash noted.   Cardiovascular: Normal heart rate noted  Respiratory: Normal respiratory effort, no problems with respiration noted  Abdomen: Soft, gravid, appropriate for gestational age. Pain/Pressure: Absent     Pelvic: Vag. Bleeding: None     multiparous os, otherwise normal cervix without lesions        Extremities: Normal range of motion.  Edema: None  Mental Status: Normal mood and affect. Normal behavior. Normal judgment and thought content.   Urinalysis:      Assessment and Plan:  Pregnancy: G8T1572 at [redacted]w[redacted]d  1. Encounter for supervision of low-risk pregnancy in first trimester Reviewed collaborative nature of practice with physicians, midwives, NP, PA, residents, and students Initial prenatal labs and genetic screening today Discussed contraception, considering vasectomy for partner if they have a boy but may try for another child if not Anatomy scan scheduled for 05/2020 - Hemoglobin A1c - CBC/D/Plt+RPR+Rh+ABO+Rub Ab... - Culture, OB Urine -  Cytology - PAP( [facility]) - Genetic Screening  2. History of preterm delivery 36 weeks with first delivery, subsequently two term deliveries  3. 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 Please refer to After Visit Summary for other counseling recommendations.  Return in 4 weeks (on 05/26/2020), or return OB visit.   Venora Maples, MD

## 2020-04-29 ENCOUNTER — Encounter: Payer: Self-pay | Admitting: *Deleted

## 2020-04-29 ENCOUNTER — Telehealth: Payer: Self-pay | Admitting: Family Medicine

## 2020-04-29 LAB — CBC/D/PLT+RPR+RH+ABO+RUB AB...
Antibody Screen: NEGATIVE
Basophils Absolute: 0 10*3/uL (ref 0.0–0.2)
Basos: 0 %
EOS (ABSOLUTE): 0.1 10*3/uL (ref 0.0–0.4)
Eos: 2 %
HCV Ab: <0.1 s/co ratio (ref 0.0–0.9)
HIV Screen 4th Generation wRfx: NONREACTIVE
Hematocrit: 35.1 % (ref 34.0–46.6)
Hemoglobin: 12 g/dL (ref 11.1–15.9)
Hepatitis B Surface Ag: NEGATIVE
Immature Grans (Abs): 0 10*3/uL (ref 0.0–0.1)
Immature Granulocytes: 0 %
Lymphocytes Absolute: 1.8 10*3/uL (ref 0.7–3.1)
Lymphs: 25 %
MCH: 30.5 pg (ref 26.6–33.0)
MCHC: 34.2 g/dL (ref 31.5–35.7)
MCV: 89 fL (ref 79–97)
Monocytes Absolute: 0.5 10*3/uL (ref 0.1–0.9)
Monocytes: 7 %
Neutrophils Absolute: 4.9 10*3/uL (ref 1.4–7.0)
Neutrophils: 66 %
Platelets: 248 10*3/uL (ref 150–450)
RBC: 3.93 x10E6/uL (ref 3.77–5.28)
RDW: 11.8 % (ref 11.7–15.4)
RPR Ser Ql: NONREACTIVE
Rh Factor: POSITIVE
Rubella Antibodies, IGG: 1.23 index (ref >0.99)
WBC: 7.4 10*3/uL (ref 3.4–10.8)

## 2020-04-29 LAB — HEMOGLOBIN A1C
Est. average glucose Bld gHb Est-mCnc: 105 mg/dL
Hgb A1c MFr Bld: 5.3 % (ref 4.8–5.6)

## 2020-04-29 LAB — HCV INTERPRETATION

## 2020-04-29 NOTE — Telephone Encounter (Signed)
Left pt a voicemail regarding appointment with Asher Muir. Advised that it was first available and if that day and time was not ok that pt can call back to reschedule.

## 2020-04-30 LAB — URINE CULTURE, OB REFLEX: Organism ID, Bacteria: NO GROWTH

## 2020-04-30 LAB — CULTURE, OB URINE

## 2020-05-01 ENCOUNTER — Encounter: Payer: Self-pay | Admitting: Family Medicine

## 2020-05-01 LAB — CYTOLOGY - PAP
Chlamydia: NEGATIVE
Comment: NEGATIVE
Comment: NEGATIVE
Comment: NEGATIVE
Comment: NORMAL
Diagnosis: UNDETERMINED — AB
High risk HPV: NEGATIVE
Neisseria Gonorrhea: NEGATIVE
Trichomonas: NEGATIVE

## 2020-05-04 ENCOUNTER — Encounter: Payer: Self-pay | Admitting: *Deleted

## 2020-05-04 NOTE — BH Specialist Note (Signed)
Integrated Behavioral Health via Telemedicine Visit  04/28/2020 Anna [name] [id number]  Number of Integrated Behavioral Health visits: 1 Session Start time: 8:45  Session End time: 9:25 Total time: 40   Referring Provider: Merian Capron, MD Patient/Family location: Home [facility] Provider location: [facility] for [facility]  All persons participating in visit: Patient Anna [name] and [facility] Anna [name]   Types of Service: Individual psychotherapy  I connected with Anna [name] and/or Anna [name] n/a by Video enabled telemedicine application [employer] and verified that I am speaking with the correct person using two identifiers.    Discussed confidentiality: Yes   I discussed the limitations of telemedicine and the availability of in person appointments.  Discussed there is a possibility of technology failure and discussed alternative modes of communication if that failure occurs.  I discussed that engaging in this telemedicine visit, they consent to the provision of behavioral healthcare and the services will be billed under their insurance.  Patient and/or legal guardian expressed understanding and consented to Telemedicine visit: Yes   Presenting Concerns: Patient and/or family reports the following symptoms/concerns: Pt states her primary concern today is feeling anxious and concerned about the pregnancy, along with more nausea than previous pregnancies; pt is sleeping well (7-8 hours nightly, plus nap if needed); uses deep breathing to cope.  Duration of problem: Current pregnancy; Severity of problem: mild  Patient and/or Family's Strengths/Protective Factors: Self-awareness; self-coping w deep breathing, family support  Goals Addressed: Patient will: 1.  Reduce symptoms of: stress  2.  Increase knowledge and/or ability of: healthy habits and self-management skills  3.  Demonstrate ability to: Increase healthy adjustment to current life  circumstances  Progress towards Goals: Ongoing  Interventions: Interventions utilized:  Mindfulness or Management consultant and Psychoeducation and/or Health Education Standardized Assessments completed: PHQ9/GAD7 in past two weeks  Patient and/or Family Response: Pt agrees to treatment plan  Assessment: Patient currently experiencing Psychosocial stress.   Patient may benefit from psychoeducation and brief therapeutic interventions regarding coping with symptoms of stress   Plan: 1. Follow up with behavioral health clinician on : Call Devina Bezold at [phone] as needed to schedule follow up appointment 2. Behavioral recommendations:  -Continue taking prenatal vitamin and promethazine(nausea)  as prescribed -Continue using deep breathing exercise as needed throughout the day -CALM relaxation breathing exercise at least once daily (preferably first hour of each day) -Continue to prioritize healthy sleep for remainder of pregnancy (Sleep as medicine) 3. Referral(s): Integrated [employer] (In Clinic)  I discussed the assessment and treatment plan with the patient and/or parent/guardian. They were provided an opportunity to ask questions and all were answered. They agreed with the plan and demonstrated an understanding of the instructions.   They were advised to call back or seek an in-person evaluation if the symptoms worsen or if the condition fails to improve as anticipated.  Valetta Close [facility]  Depression screen [facility] 2/9 04/28/2020 04/21/2020 06/06/2018 02/28/2018 02/12/2018  Decreased Interest 0 1 1 1 1   Down, Depressed, Hopeless 0 0 1 2 1   PHQ - 2 Score 0 1 2 3 2   Altered sleeping 0 1 3 1 2   Tired, decreased energy 1 2 2 1 1   Change in appetite 1 2 2 1  0  Feeling bad or failure about yourself  0 0 0 1 0  Trouble concentrating 0 0 3 1 1   Moving slowly or fidgety/restless 0 0 0 0 0  Suicidal thoughts 0 0 0 0  0  PHQ-9 Score 2 6 12 8 6    GAD 7 : Generalized Anxiety  Score 04/28/2020 04/21/2020 06/06/2018 02/28/2018  Nervous, Anxious, on Edge 1 1 3 2   Control/stop worrying 1 1 3 2   Worry too much - different things 1 1 3 2   Trouble relaxing 0 1 3 2   Restless 0 0 1 0  Easily annoyed or irritable 0 0 0 1  Afraid - awful might happen 0 1 2 0  Total GAD 7 Score 3 5 15  9

## 2020-05-07 ENCOUNTER — Other Ambulatory Visit: Payer: Self-pay

## 2020-05-08 ENCOUNTER — Telehealth: Payer: Self-pay | Admitting: Lactation Services

## 2020-05-08 DIAGNOSIS — Z3491 Encounter for supervision of normal pregnancy, unspecified, first trimester: Secondary | ICD-10-CM

## 2020-05-08 NOTE — Telephone Encounter (Signed)
Hor[employer] Proofreader results showed High Risk indicating either a vanishing twin, multiple pregnancy, or increased risk of Fetal Triploidy. Patient has not had a previous US.   Spoke with Dr. Crissie Reese who ordered an [place]. Scheduled for 12/15 @ 3pm.   Called patient and she did not answer. LM for patient to call the office for results. Advised patient to check My Chart message.   My Chart message sent.

## 2020-05-11 ENCOUNTER — Other Ambulatory Visit: Payer: Self-pay

## 2020-05-11 ENCOUNTER — Other Ambulatory Visit: Payer: Self-pay | Admitting: Family Medicine

## 2020-05-11 ENCOUNTER — Other Ambulatory Visit: Payer: Self-pay | Admitting: *Deleted

## 2020-05-11 DIAGNOSIS — O285 Abnormal chromosomal and genetic finding on antenatal screening of mother: Secondary | ICD-10-CM

## 2020-05-11 DIAGNOSIS — Z3A13 13 weeks gestation of pregnancy: Secondary | ICD-10-CM

## 2020-05-12 ENCOUNTER — Other Ambulatory Visit: Payer: Self-pay

## 2020-05-12 ENCOUNTER — Ambulatory Visit (INDEPENDENT_AMBULATORY_CARE_PROVIDER_SITE_OTHER): Payer: Medicaid Other | Admitting: Clinical

## 2020-05-12 DIAGNOSIS — Z658 Other specified problems related to psychosocial circumstances: Secondary | ICD-10-CM | POA: Diagnosis not present

## 2020-05-12 NOTE — Patient Instructions (Signed)
[facility] [address] [place], [place] [address] [phone] (main office) [phone] (Rondrick Barreira's office)  [email or web address]   /Emotional Wellbeing Apps and Websites Here are a few free apps meant to help you to help yourself.  To find, try searching on the internet to see if the app is offered on Apple/Android devices. If your first choice doesn't come up on your device, the good news is that there are many choices! Play around with different apps to see which ones are helpful to you.    Calm This is an app meant to help increase calm feelings. Includes info, strategies, and tools for tracking your feelings.      Calm Harm  This app is meant to help with self-harm. Provides many 5-minute or 15-min coping strategies for doing instead of hurting yourself.       Healthy Minds Health Minds is a problem-solving tool to help deal with emotions and cope with stress you encounter wherever you are.      MindShift This app can help people cope with anxiety. Rather than trying to avoid anxiety, you can make an important shift and face it.      MY3  MY3 features a support system, safety plan and resources with the goal of offering a tool to use in a time of need.       My Life My Voice  This mood journal offers a simple solution for tracking your thoughts, feelings and moods. Animated emoticons can help identify your mood.       Relax Melodies Designed to help with sleep, on this app you can mix sounds and meditations for relaxation.      Smiling Mind Smiling Mind is meditation made easy: it's a simple tool that helps put a smile on your mind.        Stop, Breathe & Think  A friendly, simple guide for people through meditations for mindfulness and compassion.  Stop, Breathe and Think Kids Enter your current feelings and choose a "mission" to help you cope. Offers videos for certain moods instead of just sound  recordings.       Team Orange The goal of this tool is to help teens change how they think, act, and react. This app helps you focus on your own good feelings and experiences.      The Virtual Hope Box The Virtual Hope Box (VHB) contains simple tools to help patients with coping, relaxation, distraction, and positive thinking.     

## 2020-05-13 ENCOUNTER — Other Ambulatory Visit: Payer: Self-pay | Admitting: Family Medicine

## 2020-05-13 ENCOUNTER — Ambulatory Visit: Payer: Medicaid Other | Admitting: *Deleted

## 2020-05-13 ENCOUNTER — Encounter: Payer: Self-pay | Admitting: *Deleted

## 2020-05-13 ENCOUNTER — Ambulatory Visit: Admission: RE | Admit: 2020-05-13 | Payer: Medicaid Other | Source: Ambulatory Visit

## 2020-05-13 ENCOUNTER — Ambulatory Visit: Payer: Medicaid Other | Attending: Family Medicine

## 2020-05-13 ENCOUNTER — Other Ambulatory Visit: Payer: Self-pay

## 2020-05-13 ENCOUNTER — Encounter: Payer: Self-pay | Admitting: Family Medicine

## 2020-05-13 DIAGNOSIS — Z3A13 13 weeks gestation of pregnancy: Secondary | ICD-10-CM | POA: Diagnosis not present

## 2020-05-13 DIAGNOSIS — O285 Abnormal chromosomal and genetic finding on antenatal screening of mother: Secondary | ICD-10-CM | POA: Diagnosis not present

## 2020-05-13 DIAGNOSIS — O30041 Twin pregnancy, dichorionic/diamniotic, first trimester: Secondary | ICD-10-CM | POA: Diagnosis not present

## 2020-05-13 DIAGNOSIS — Z8751 Personal history of pre-term labor: Secondary | ICD-10-CM | POA: Diagnosis present

## 2020-05-13 DIAGNOSIS — O30049 Twin pregnancy, dichorionic/diamniotic, unspecified trimester: Secondary | ICD-10-CM | POA: Insufficient documentation

## 2020-05-14 ENCOUNTER — Encounter: Payer: Self-pay | Admitting: *Deleted

## 2020-05-15 ENCOUNTER — Other Ambulatory Visit: Payer: Self-pay

## 2020-05-18 ENCOUNTER — Telehealth: Payer: Self-pay | Admitting: Genetic Counselor

## 2020-05-18 NOTE — Telephone Encounter (Signed)
Attempted to call Ms. Geeslin to discuss her recalculated low-risk noninvasive prenatal screening (NIPS) results; however, call went to voicemail and mailbox was full so I could not leave message. Will try again tomorrow.  Gershon Crane, MS, [facility] Genetic Counselor

## 2020-05-19 ENCOUNTER — Telehealth: Payer: Self-pay | Admitting: Genetic Counselor

## 2020-05-19 NOTE — Telephone Encounter (Addendum)
LVM for Ms. Heinecke with results from her recalculated noninvasive prenatal screening (NIPS) as an identifier was presented in her voicemail. Ms. Aydelott NIPS result initially came back high risk for a vanishing twin, triploidy, or unrecognized multiple gestation. Ultrasound last week confirmed the presence of twins. I contacted Avelina Laine with this new information at Dr. Loretta Plume request and they recalculated Ms. Vicuna's Panorama NIPS result. I informed her that her updated result is negative, demonstrating an expected representation of chromosomes 21, 18, 13 for both twins. This greatly reduces the likelihood of trisomies 49, 63, or 18 for the twins. NIPS confirmed that the twins are dizygotic AKA fraternal.    NIPS analyzes placental (fetal) DNA in maternal circulation. NIPS is considered to be highly specific and sensitive, but is not considered to be a diagnostic test. I reviewed that this testing identifies 91-99% of pregnancies with trisomies 30, 8, and 18, but does not test for all genetic conditions. Diagnostic testing via amniocentesis is available from 16 weeks' gestation should Ms. Worthington be interested in confirming this result. I encouraged her to contact me if she had any questions about these results or wants to know fetal sex information  Gershon Crane, MS, Aeronautical engineer

## 2020-05-27 ENCOUNTER — Ambulatory Visit (INDEPENDENT_AMBULATORY_CARE_PROVIDER_SITE_OTHER): Payer: Medicaid Other | Admitting: Medical

## 2020-05-27 ENCOUNTER — Other Ambulatory Visit: Payer: Self-pay

## 2020-05-27 ENCOUNTER — Encounter: Payer: Self-pay | Admitting: Medical

## 2020-05-27 VITALS — BP 116/72 | HR 81 | Wt 150.9 lb

## 2020-05-27 DIAGNOSIS — O0990 Supervision of high risk pregnancy, unspecified, unspecified trimester: Secondary | ICD-10-CM

## 2020-05-27 DIAGNOSIS — O30042 Twin pregnancy, dichorionic/diamniotic, second trimester: Secondary | ICD-10-CM

## 2020-05-27 DIAGNOSIS — O26892 Other specified pregnancy related conditions, second trimester: Secondary | ICD-10-CM | POA: Insufficient documentation

## 2020-05-27 DIAGNOSIS — R87610 Atypical squamous cells of undetermined significance on cytologic smear of cervix (ASC-US): Secondary | ICD-10-CM

## 2020-05-27 DIAGNOSIS — Z3A15 15 weeks gestation of pregnancy: Secondary | ICD-10-CM

## 2020-05-27 DIAGNOSIS — R12 Heartburn: Secondary | ICD-10-CM | POA: Insufficient documentation

## 2020-05-27 DIAGNOSIS — Z8751 Personal history of pre-term labor: Secondary | ICD-10-CM

## 2020-05-27 MED ORDER — OMEPRAZOLE 20 MG PO CPDR: 20.0000 mg | DELAYED_RELEASE_CAPSULE | Freq: Two times a day (BID) | ORAL | 6 refills | Status: DC

## 2020-05-27 NOTE — Patient Instructions (Addendum)
Second Trimester of Pregnancy  The second trimester is from week 14 through week 27 (month 4 through 6). This is often the time in pregnancy that you feel your best. Often times, morning sickness has lessened or quit. You may have more energy, and you may get hungry more often. Your unborn baby is growing rapidly. At the end of the sixth month, he or she is about 9 inches long and weighs about 1 pounds. You will likely feel the baby move between 18 and 20 weeks of pregnancy. Follow these instructions at home: Medicines  Take over-the-counter and prescription medicines only as told by your doctor. Some medicines are safe and some medicines are not safe during pregnancy.  Take a prenatal vitamin that contains at least 600 micrograms (mcg) of folic acid.  If you have trouble pooping (constipation), take medicine that will make your stool soft (stool softener) if your doctor approves. Eating and drinking   Eat regular, healthy meals.  Avoid raw meat and uncooked cheese.  If you get low calcium from the food you eat, talk to your doctor about taking a daily calcium supplement.  Avoid foods that are high in fat and sugars, such as fried and sweet foods.  If you feel sick to your stomach (nauseous) or throw up (vomit): ? Eat 4 or 5 small meals a day instead of 3 large meals. ? Try eating a few soda crackers. ? Drink liquids between meals instead of during meals.  To prevent constipation: ? Eat foods that are high in fiber, like fresh fruits and vegetables, whole grains, and beans. ? Drink enough fluids to keep your pee (urine) clear or pale yellow. Activity  Exercise only as told by your doctor. Stop exercising if you start to have cramps.  Do not exercise if it is too hot, too humid, or if you are in a place of great height (high altitude).  Avoid heavy lifting.  Wear low-heeled shoes. Sit and stand up straight.  You can continue to have sex unless your doctor tells you not  to. Relieving pain and discomfort  Wear a good support bra if your breasts are tender.  Take warm water baths (sitz baths) to soothe pain or discomfort caused by hemorrhoids. Use hemorrhoid cream if your doctor approves.  Rest with your legs raised if you have leg cramps or low back pain.  If you develop puffy, bulging veins (varicose veins) in your legs: ? Wear support hose or compression stockings as told by your doctor. ? Raise (elevate) your feet for 15 minutes, 3-4 times a day. ? Limit salt in your food. Prenatal care  Write down your questions. Take them to your prenatal visits.  Keep all your prenatal visits as told by your doctor. This is important. Safety  Wear your seat belt when driving.  Make a list of emergency phone numbers, including numbers for family, friends, the hospital, and police and fire departments. General instructions  Ask your doctor about the right foods to eat or for help finding a counselor, if you need these services.  Ask your doctor about local prenatal classes. Begin classes before month 6 of your pregnancy.  Do not use hot tubs, steam rooms, or saunas.  Do not douche or use tampons or scented sanitary pads.  Do not cross your legs for long periods of time.  Visit your dentist if you have not done so. Use a soft toothbrush to brush your teeth. Floss gently.  Avoid all smoking, herbs,   and alcohol. Avoid drugs that are not approved by your doctor.  Do not use any products that contain nicotine or tobacco, such as cigarettes and e-cigarettes. If you need help quitting, ask your doctor.  Avoid cat litter boxes and soil used by cats. These carry germs that can cause birth defects in the baby and can cause a loss of your baby (miscarriage) or stillbirth. Contact a doctor if:  You have mild cramps or pressure in your lower belly.  You have pain when you pee (urinate).  You have bad smelling fluid coming from your vagina.  You continue to  feel sick to your stomach (nauseous), throw up (vomit), or have watery poop (diarrhea).  You have a nagging pain in your belly area.  You feel dizzy. Get help right away if:  You have a fever.  You are leaking fluid from your vagina.  You have spotting or bleeding from your vagina.  You have severe belly cramping or pain.  You lose or gain weight rapidly.  You have trouble catching your breath and have chest pain.  You notice sudden or extreme puffiness (swelling) of your face, hands, ankles, feet, or legs.  You have not felt the baby move in over an hour.  You have severe headaches that do not go away when you take medicine.  You have trouble seeing. Summary  The second trimester is from week 14 through week 27 (months 4 through 6). This is often the time in pregnancy that you feel your best.  To take care of yourself and your unborn baby, you will need to eat healthy meals, take medicines only if your doctor tells you to do so, and do activities that are safe for you and your baby.  Call your doctor if you get sick or if you notice anything unusual about your pregnancy. Also, call your doctor if you need help with the right food to eat, or if you want to know what activities are safe for you. This information is not intended to replace advice given to you by your health care provider. Make sure you discuss any questions you have with your health care provider. Document Revised: 09/07/2018 Document Reviewed: 06/21/2016 Elsevier Patient Education  2020 [employer].  Food Choices for Gastroesophageal Reflux Disease, Adult When you have gastroesophageal reflux disease (GERD), the foods you eat and your eating habits are very important. Choosing the right foods can help ease your discomfort. Think about working with a nutrition specialist (dietitian) to help you make good choices. What are tips for following this plan?  Meals  Choose healthy foods that are low in fat, such  as fruits, vegetables, whole grains, low-fat dairy products, and lean meat, fish, and poultry.  Eat small meals often instead of 3 large meals a day. Eat your meals slowly, and in a place where you are relaxed. Avoid bending over or lying down until 2-3 hours after eating.  Avoid eating meals 2-3 hours before bed.  Avoid drinking a lot of liquid with meals.  Cook foods using methods other than frying. Bake, grill, or broil food instead.  Avoid or limit: ? Chocolate. ? Peppermint or spearmint. ? Alcohol. ? Pepper. ? Black and decaffeinated coffee. ? Black and decaffeinated tea. ? Bubbly (carbonated) soft drinks. ? Caffeinated energy drinks and soft drinks.  Limit high-fat foods such as: ? Fatty meat or fried foods. ? Whole milk, cream, butter, or ice cream. ? Nuts and nut butters. ? Pastries, donuts, and sweets  made with butter or shortening.  Avoid foods that cause symptoms. These foods may be different for everyone. Common foods that cause symptoms include: ? Tomatoes. ? Oranges, lemons, and limes. ? Peppers. ? Spicy food. ? Onions and garlic. ? Vinegar. Lifestyle  Maintain a healthy weight. Ask your doctor what weight is healthy for you. If you need to lose weight, work with your doctor to do so safely.  Exercise for at least 30 minutes for 5 or more days each week, or as told by your doctor.  Wear loose-fitting clothes.  Do not smoke. If you need help quitting, ask your doctor.  Sleep with the head of your bed higher than your feet. Use a wedge under the mattress or blocks under the bed frame to raise the head of the bed. Summary  When you have gastroesophageal reflux disease (GERD), food and lifestyle choices are very important in easing your symptoms.  Eat small meals often instead of 3 large meals a day. Eat your meals slowly, and in a place where you are relaxed.  Limit high-fat foods such as fatty meat or fried foods.  Avoid bending over or lying down until  2-3 hours after eating.  Avoid peppermint and spearmint, caffeine, alcohol, and chocolate. This information is not intended to replace advice given to you by your health care provider. Make sure you discuss any questions you have with your health care provider. Document Revised: 09/06/2018 Document Reviewed: 06/21/2016 Elsevier Patient Education  [employer].   For carrier screen partner testing call Avelina Laine at [phone] to request a test

## 2020-05-27 NOTE — Progress Notes (Signed)
   PRENATAL VISIT NOTE  Subjective:  Anna [name] is a 29 y.o. [phone] at [redacted]w[redacted]d being seen today for ongoing prenatal care.  She is currently monitored for the following issues for this high-risk pregnancy and has ASCUS of cervix with negative high risk HPV; Supervision of high risk pregnancy, antepartum; History of preterm delivery; Dichorionic diamniotic twin gestation; and Heartburn in pregnancy in second trimester on their problem list.  Patient reports nausea, vomiting and occasional round ligament pain.  Contractions: Not present. Vag. Bleeding: None.  Movement: Present. Denies leaking of fluid.   The following portions of the patient's history were reviewed and updated as appropriate: allergies, current medications, past family history, past medical history, past social history, past surgical history and problem list.   Objective:   Vitals:   05/27/20 1055  BP: 116/72  Pulse: 81  Weight: 150 lb 14.4 oz (68.4 kg)    Fetal Status: Fetal Heart Rate (bpm): 141/154   Movement: Present     General:  Alert, oriented and cooperative. Patient is in no acute distress.  Skin: Skin is warm and dry. No rash noted.   Cardiovascular: Normal heart rate noted  Respiratory: Normal respiratory effort, no problems with respiration noted  Abdomen: Soft, gravid, appropriate for gestational age.  Pain/Pressure: Present     Pelvic: Cervical exam deferred        Extremities: Normal range of motion.  Edema: None  Mental Status: Normal mood and affect. Normal behavior. Normal judgment and thought content.   Assessment and Plan:  Pregnancy: [id number] at [redacted]w[redacted]d 1. Supervision of high risk pregnancy, antepartum - AFP, Serum, Open Spina Bifida - Declined flu and COVID vaccine - Discussed MOD and options depending on presentation - SMA carrier -   2. Dichorionic diamniotic twin pregnancy in second trimester - Anatomy US scheduled 06/22/20 - Discussed cadence of [place] and reason for additional [place] with twin  gestation   3. History of preterm delivery - Delivered first child at 36 weeks, second and third at 72 and 41 weeks without interventions - Declined 17-P  4. ASCUS of cervix with negative high risk HPV - Needs PP colposcopy  5. Heartburn in pregnancy in second trimester - Diet for GERD discussed and information included on AVS - omeprazole (PRILOSEC) 20 MG capsule; Take 1 capsule (20 mg total) by mouth 2 (two) times daily.  Dispense: 30 capsule; Refill: 6  6. [redacted] weeks gestation of pregnancy  Preterm labor/second trimester warning signs symptoms and general obstetric precautions including but not limited to vaginal bleeding, contractions, leaking of fluid and fetal movement were reviewed in detail with the patient. Please refer to After Visit Summary for other counseling recommendations.   Return in about 4 weeks (around 06/24/2020) for [facility] MD only or Raynelle Fanning .  Future Appointments  Date Time Provider Department Center  06/22/2020 10:00 AM [facility] NURSE [facility]  06/22/2020 10:15 AM [facility] US2 [facility]    Vonzella Nipple, PA-C

## 2020-05-29 LAB — AFP, SERUM, OPEN SPINA BIFIDA
AFP MoM: 1.95
AFP Value: 63.4 ng/mL
Gest. Age on Collection Date: 15 weeks
Maternal Age At EDD: 31.9 yr
OSBR Risk 1 IN: 3715
Test Results:: NEGATIVE
Weight: 150 [lb_av]

## 2020-06-22 ENCOUNTER — Other Ambulatory Visit: Payer: Self-pay | Admitting: *Deleted

## 2020-06-22 ENCOUNTER — Encounter: Payer: Self-pay | Admitting: *Deleted

## 2020-06-22 ENCOUNTER — Ambulatory Visit: Payer: Medicaid Other | Attending: Family Medicine

## 2020-06-22 ENCOUNTER — Ambulatory Visit (INDEPENDENT_AMBULATORY_CARE_PROVIDER_SITE_OTHER): Payer: Medicaid Other | Admitting: Obstetrics and Gynecology

## 2020-06-22 ENCOUNTER — Encounter: Payer: Self-pay | Admitting: Obstetrics and Gynecology

## 2020-06-22 ENCOUNTER — Ambulatory Visit: Payer: Medicaid Other | Admitting: *Deleted

## 2020-06-22 ENCOUNTER — Other Ambulatory Visit: Payer: Self-pay

## 2020-06-22 ENCOUNTER — Other Ambulatory Visit: Payer: Self-pay | Admitting: Family Medicine

## 2020-06-22 VITALS — BP 114/70 | HR 78 | Wt 159.8 lb

## 2020-06-22 DIAGNOSIS — Z3A19 19 weeks gestation of pregnancy: Secondary | ICD-10-CM

## 2020-06-22 DIAGNOSIS — Z8751 Personal history of pre-term labor: Secondary | ICD-10-CM | POA: Diagnosis not present

## 2020-06-22 DIAGNOSIS — O0990 Supervision of high risk pregnancy, unspecified, unspecified trimester: Secondary | ICD-10-CM

## 2020-06-22 DIAGNOSIS — Z3491 Encounter for supervision of normal pregnancy, unspecified, first trimester: Secondary | ICD-10-CM

## 2020-06-22 DIAGNOSIS — O358XX1 Maternal care for other (suspected) fetal abnormality and damage, fetus 1: Secondary | ICD-10-CM | POA: Diagnosis not present

## 2020-06-22 DIAGNOSIS — O30042 Twin pregnancy, dichorionic/diamniotic, second trimester: Secondary | ICD-10-CM

## 2020-06-22 DIAGNOSIS — Z362 Encounter for other antenatal screening follow-up: Secondary | ICD-10-CM

## 2020-06-22 DIAGNOSIS — Z363 Encounter for antenatal screening for malformations: Secondary | ICD-10-CM | POA: Insufficient documentation

## 2020-06-22 DIAGNOSIS — R87610 Atypical squamous cells of undetermined significance on cytologic smear of cervix (ASC-US): Secondary | ICD-10-CM

## 2020-06-22 MED ORDER — ASPIRIN EC 81 MG PO TBEC: 81.0000 mg | DELAYED_RELEASE_TABLET | Freq: Every day | ORAL | 2 refills | Status: DC

## 2020-06-22 NOTE — Progress Notes (Signed)
Subjective:  Anna [name] is a 30 y.o. [phone] at [redacted]w[redacted]d being seen today for ongoing prenatal care.  She is currently monitored for the following issues for this high-risk pregnancy and has ASCUS of cervix with negative high risk HPV; Supervision of high risk pregnancy, antepartum; History of preterm delivery; Dichorionic diamniotic twin gestation; and Heartburn in pregnancy in second trimester on their problem list.  Patient reports no complaints.  Contractions: Not present. Vag. Bleeding: None.  Movement: Present. Denies leaking of fluid.   The following portions of the patient's history were reviewed and updated as appropriate: allergies, current medications, past family history, past medical history, past social history, past surgical history and problem list. Problem list updated.  Objective:   Vitals:   06/22/20 0914  BP: 114/70  Pulse: 78  Weight: 72.5 kg    Fetal Status: Fetal Heart Rate (bpm): 156/145   Movement: Present     General:  Alert, oriented and cooperative. Patient is in no acute distress.  Skin: Skin is warm and dry. No rash noted.   Cardiovascular: Normal heart rate noted  Respiratory: Normal respiratory effort, no problems with respiration noted  Abdomen: Soft, gravid, appropriate for gestational age. Pain/Pressure: Present     Pelvic:  Cervical exam deferred        Extremities: Normal range of motion.  Edema: None  Mental Status: Normal mood and affect. Normal behavior. Normal judgment and thought content.   Urinalysis:      Assessment and Plan:  Pregnancy: [id number] at [redacted]w[redacted]d  1. Supervision of high risk pregnancy, antepartum Stable Anatomy scan today  2. Dichorionic diamniotic twin pregnancy in second trimester Start BASA Anatomy scan today  3. History of preterm delivery Declined 17 OHP  4. ASCUS of cervix with negative high risk HPV Colpo vs repeat pap smear PP  Preterm labor symptoms and general obstetric precautions including but not limited to  vaginal bleeding, contractions, leaking of fluid and fetal movement were reviewed in detail with the patient. Please refer to After Visit Summary for other counseling recommendations.  Return in about 4 weeks (around 07/20/2020) for OB visit, virtual, any provider.   Hermina Staggers, MD

## 2020-06-22 NOTE — Patient Instructions (Signed)

## 2020-07-14 ENCOUNTER — Other Ambulatory Visit: Payer: Self-pay | Admitting: Medical

## 2020-07-14 DIAGNOSIS — R12 Heartburn: Secondary | ICD-10-CM

## 2020-07-14 DIAGNOSIS — O26892 Other specified pregnancy related conditions, second trimester: Secondary | ICD-10-CM

## 2020-07-20 ENCOUNTER — Other Ambulatory Visit: Payer: Self-pay | Admitting: *Deleted

## 2020-07-20 ENCOUNTER — Ambulatory Visit: Payer: Medicaid Other | Admitting: *Deleted

## 2020-07-20 ENCOUNTER — Other Ambulatory Visit: Payer: Self-pay

## 2020-07-20 ENCOUNTER — Encounter: Payer: Self-pay | Admitting: *Deleted

## 2020-07-20 ENCOUNTER — Ambulatory Visit: Payer: Medicaid Other | Attending: Obstetrics and Gynecology

## 2020-07-20 DIAGNOSIS — O30042 Twin pregnancy, dichorionic/diamniotic, second trimester: Secondary | ICD-10-CM

## 2020-07-20 DIAGNOSIS — Z3A23 23 weeks gestation of pregnancy: Secondary | ICD-10-CM

## 2020-07-20 DIAGNOSIS — O0990 Supervision of high risk pregnancy, unspecified, unspecified trimester: Secondary | ICD-10-CM | POA: Insufficient documentation

## 2020-07-20 DIAGNOSIS — Z363 Encounter for antenatal screening for malformations: Secondary | ICD-10-CM

## 2020-07-20 DIAGNOSIS — O322XX2 Maternal care for transverse and oblique lie, fetus 2: Secondary | ICD-10-CM

## 2020-07-20 DIAGNOSIS — O358XX0 Maternal care for other (suspected) fetal abnormality and damage, not applicable or unspecified: Secondary | ICD-10-CM | POA: Diagnosis not present

## 2020-07-20 DIAGNOSIS — Z148 Genetic carrier of other disease: Secondary | ICD-10-CM

## 2020-07-20 DIAGNOSIS — Z362 Encounter for other antenatal screening follow-up: Secondary | ICD-10-CM | POA: Diagnosis not present

## 2020-07-20 DIAGNOSIS — Z8751 Personal history of pre-term labor: Secondary | ICD-10-CM | POA: Diagnosis not present

## 2020-07-22 ENCOUNTER — Telehealth (INDEPENDENT_AMBULATORY_CARE_PROVIDER_SITE_OTHER): Payer: Medicaid Other | Admitting: Obstetrics and Gynecology

## 2020-07-22 DIAGNOSIS — O0990 Supervision of high risk pregnancy, unspecified, unspecified trimester: Secondary | ICD-10-CM

## 2020-07-22 NOTE — Progress Notes (Signed)
1:19p-Called Pt for My Chart visit,no answer,left VM that will call back in 10 to 15 minutes.  1:57p- Called Pt for My Chart visit, request for it to be rescheduled.

## 2020-07-23 ENCOUNTER — Encounter: Payer: Self-pay | Admitting: Obstetrics and Gynecology

## 2020-08-18 ENCOUNTER — Other Ambulatory Visit: Payer: Self-pay | Admitting: *Deleted

## 2020-08-18 ENCOUNTER — Ambulatory Visit: Payer: Medicaid Other | Attending: Obstetrics and Gynecology

## 2020-08-18 ENCOUNTER — Ambulatory Visit: Payer: Medicaid Other | Admitting: *Deleted

## 2020-08-18 ENCOUNTER — Other Ambulatory Visit: Payer: Self-pay

## 2020-08-18 ENCOUNTER — Encounter: Payer: Self-pay | Admitting: *Deleted

## 2020-08-18 DIAGNOSIS — O322XX2 Maternal care for transverse and oblique lie, fetus 2: Secondary | ICD-10-CM

## 2020-08-18 DIAGNOSIS — Z148 Genetic carrier of other disease: Secondary | ICD-10-CM

## 2020-08-18 DIAGNOSIS — O358XX2 Maternal care for other (suspected) fetal abnormality and damage, fetus 2: Secondary | ICD-10-CM | POA: Diagnosis not present

## 2020-08-18 DIAGNOSIS — Z3A27 27 weeks gestation of pregnancy: Secondary | ICD-10-CM

## 2020-08-18 DIAGNOSIS — O30042 Twin pregnancy, dichorionic/diamniotic, second trimester: Secondary | ICD-10-CM | POA: Insufficient documentation

## 2020-08-18 DIAGNOSIS — Z8751 Personal history of pre-term labor: Secondary | ICD-10-CM | POA: Diagnosis not present

## 2020-08-18 DIAGNOSIS — O0990 Supervision of high risk pregnancy, unspecified, unspecified trimester: Secondary | ICD-10-CM | POA: Diagnosis not present

## 2020-08-24 ENCOUNTER — Other Ambulatory Visit: Payer: Self-pay | Admitting: Medical

## 2020-08-24 DIAGNOSIS — O26892 Other specified pregnancy related conditions, second trimester: Secondary | ICD-10-CM

## 2020-09-16 ENCOUNTER — Ambulatory Visit: Payer: Medicaid Other | Attending: Obstetrics

## 2020-09-16 ENCOUNTER — Other Ambulatory Visit: Payer: Self-pay

## 2020-09-16 ENCOUNTER — Ambulatory Visit (INDEPENDENT_AMBULATORY_CARE_PROVIDER_SITE_OTHER): Payer: Medicaid Other | Admitting: Certified Nurse Midwife

## 2020-09-16 ENCOUNTER — Ambulatory Visit: Payer: Medicaid Other | Admitting: *Deleted

## 2020-09-16 ENCOUNTER — Encounter: Payer: Self-pay | Admitting: *Deleted

## 2020-09-16 ENCOUNTER — Other Ambulatory Visit: Payer: Self-pay | Admitting: *Deleted

## 2020-09-16 VITALS — BP 121/68 | HR 93 | Wt 175.0 lb

## 2020-09-16 DIAGNOSIS — O0990 Supervision of high risk pregnancy, unspecified, unspecified trimester: Secondary | ICD-10-CM | POA: Diagnosis not present

## 2020-09-16 DIAGNOSIS — O30042 Twin pregnancy, dichorionic/diamniotic, second trimester: Secondary | ICD-10-CM | POA: Diagnosis not present

## 2020-09-16 DIAGNOSIS — Z8751 Personal history of pre-term labor: Secondary | ICD-10-CM

## 2020-09-16 DIAGNOSIS — Z148 Genetic carrier of other disease: Secondary | ICD-10-CM | POA: Diagnosis not present

## 2020-09-16 DIAGNOSIS — O30043 Twin pregnancy, dichorionic/diamniotic, third trimester: Secondary | ICD-10-CM

## 2020-09-16 DIAGNOSIS — Z363 Encounter for antenatal screening for malformations: Secondary | ICD-10-CM | POA: Diagnosis not present

## 2020-09-16 DIAGNOSIS — Z3A31 31 weeks gestation of pregnancy: Secondary | ICD-10-CM

## 2020-09-16 DIAGNOSIS — O358XX2 Maternal care for other (suspected) fetal abnormality and damage, fetus 2: Secondary | ICD-10-CM

## 2020-09-16 DIAGNOSIS — O321XX2 Maternal care for breech presentation, fetus 2: Secondary | ICD-10-CM

## 2020-09-16 DIAGNOSIS — Z23 Encounter for immunization: Secondary | ICD-10-CM

## 2020-09-16 DIAGNOSIS — O322XX1 Maternal care for transverse and oblique lie, fetus 1: Secondary | ICD-10-CM

## 2020-09-16 NOTE — Patient Instructions (Signed)
Glucose Tolerance Test Why am I having this test? The glucose tolerance test (GTT) is done to check how your body processes sugar (glucose). This is one of several tests used to diagnose diabetes (diabetes mellitus). Your health care provider may recommend this test if you:  Have a family history of diabetes.  Are obese.  Have infections that keep coming back.  Have had a lot of wounds that did not heal quickly, especially on your legs and feet.  Are a woman and have a history of giving birth to very large babies or a history of repeated fetal loss (stillbirth).  Have had high glucose levels in your urine or blood: ? During a past pregnancy. ? After a heart attack, surgery, or prolonged periods of high stress. What is being tested? This test measures the amount of glucose in your blood at different times during a period of 2 hours. This indicates how well your body is able to process glucose. What kind of sample is taken? Blood samples are required for this test. They are usually collected by inserting a needle into a blood vessel.   How do I prepare for this test?  For 3 days before your test, eat normally. Have plenty of carbohydrate-rich foods.  Follow instructions from your health care provider about: ? Eating or drinking restrictions on the day of the test. You may be asked to not eat or drink anything other than water (fast) starting 8-12 hours before the test. ? Changing or stopping your regular medicines. Some medicines may interfere with this test. Tell a health care provider about:  All medicines you are taking, including vitamins, herbs, eye drops, creams, and over-the-counter medicines.  Any blood disorders you have.  Any surgeries you have had.  Any medical conditions you have.  Whether you are pregnant or may be pregnant. What happens during the test? First, your blood glucose will be measured. This is referred to as your fasting blood glucose, since you fasted  before the test. Then, you will drink a glucose solution that contains a specific amount of glucose. Your blood glucose will be measured again 1 and 2 hours after drinking the solution. This test takes 2 hours to complete. You will need to stay at the testing location during this time. During the testing period:  Do not eat or drink anything other than the glucose solution. You will be allowed to drink water.  Do not exercise.  Do not use any products that contain nicotine or tobacco. These products include cigarettes, chewing tobacco, and vaping devices, such as e-cigarettes. If you need help quitting, ask your health care provider. The testing procedure may vary among health care providers and hospitals. How are the results reported? Your test results will be reported as values. These will be given as milligrams of glucose per deciliter of blood (mg/dL) or millimoles per liter (mmol/L). Your health care provider will compare your results to normal ranges that were established after testing a large group of people (reference ranges). Reference ranges may vary among labs and hospitals. For this test, common reference ranges are:  Fasting: less than 110 mg/dL (6.1 mmol/L).  1 hour after drinking glucose: less than 180 mg/dL (10.0 mmol/L).  2 hours after drinking glucose: less than 140 mg/dL (7.8 mmol/L). What do the results mean? Results that are within the reference ranges are considered normal, meaning that your glucose levels are well controlled. Results higher than the reference ranges may mean that you recently experienced stress,  such as from an injury or a sudden (acute) condition like a heart attack or stroke, or that you have:  Diabetes.  Cushing syndrome.  Tumors such as pheochromocytoma or glucagonoma.  Kidney failure.  Pancreatitis.  Hyperthyroidism.  An infection. Talk with your health care provider about what your results mean. Questions to ask your health care  provider Ask your health care provider, or the department that is doing the test:  When will my results be ready?  How will I get my results?  What are my treatment options?  What other tests do I need?  What are my next steps? Summary  The GTT is done to check how your body processes glucose. This is one of several tests used to diagnose diabetes.  This test measures the amount of glucose in your blood at different times during a period of 2 hours. This indicates how well your body is able to process glucose.  Talk with your health care provider about what your results mean. This information is not intended to replace advice given to you by your health care provider. Make sure you discuss any questions you have with your health care provider. Document Revised: 02/12/2020 Document Reviewed: 02/12/2020 Elsevier Patient Education  2021 [employer].   Third Trimester of Pregnancy  The third trimester of pregnancy is from week 28 through week 40. This is also called months 7 through 9. This trimester is when your unborn baby (fetus) is growing very fast. At the end of the ninth month, the unborn baby is about 20 inches long. It weighs about 6-10 pounds. Body changes during your third trimester Your body continues to go through many changes during this time. The changes vary and generally return to normal after the baby is born. Physical changes  Your weight will continue to increase. You may gain 25-35 pounds (11-16 kg) by the end of the pregnancy. If you are underweight, you may gain 28-40 lb (about 13-18 kg). If you are overweight, you may gain 15-25 lb (about 7-11 kg).  You may start to get stretch marks on your hips, belly (abdomen), and breasts.  Your breasts will continue to grow and may hurt. A yellow fluid (colostrum) may leak from your breasts. This is the first milk you are making for your baby.  You may have changes in your hair.  Your belly button may stick  out.  You may have more swelling in your hands, face, or ankles. Health changes  You may have heartburn.  You may have trouble pooping (constipation).  You may get hemorrhoids. These are swollen veins in the butt that can itch or get painful.  You may have swollen veins (varicose veins) in your legs.  You may have more body aches in the pelvis, back, or thighs.  You may have more tingling or numbness in your hands, arms, and legs. The skin on your belly may also feel numb.  You may feel short of breath as your womb (uterus) gets bigger. Other changes  You may pee (urinate) more often.  You may have more problems sleeping.  You may notice the unborn baby "dropping," or moving lower in your belly.  You may have more discharge coming from your vagina.  Your joints may feel loose, and you may have pain around your pelvic bone. Follow these instructions at home: Medicines  Take over-the-counter and prescription medicines only as told by your doctor. Some medicines are not safe during pregnancy.  Take a prenatal vitamin  that contains at least 600 micrograms (mcg) of folic acid. Eating and drinking  Eat healthy meals that include: ? Fresh fruits and vegetables. ? Whole grains. ? Good sources of protein, such as meat, eggs, or tofu. ? Low-fat dairy products.  Avoid raw meat and unpasteurized juice, milk, and cheese. These carry germs that can harm you and your baby.  Eat 4 or 5 small meals rather than 3 large meals a day.  You may need to take these actions to prevent or treat trouble pooping: ? Drink enough fluids to keep your pee (urine) pale yellow. ? Eat foods that are high in fiber. These include beans, whole grains, and fresh fruits and vegetables. ? Limit foods that are high in fat and sugar. These include fried or sweet foods. Activity  Exercise only as told by your doctor. Stop exercising if you start to have cramps in your womb.  Avoid heavy lifting.  Do not  exercise if it is too hot or too humid, or if you are in a place of great height (high altitude).  If you choose to, you may have sex unless your doctor tells you not to. Relieving pain and discomfort  Take breaks often, and rest with your legs raised (elevated) if you have leg cramps or low back pain.  Take warm water baths (sitz baths) to soothe pain or discomfort caused by hemorrhoids. Use hemorrhoid cream if your doctor approves.  Wear a good support bra if your breasts are tender.  If you develop bulging, swollen veins in your legs: ? Wear support hose as told by your doctor. ? Raise your feet for 15 minutes, 3-4 times a day. ? Limit salt in your food. Safety  Talk to your doctor before traveling far distances.  Do not use hot tubs, steam rooms, or saunas.  Wear your seat belt at all times when you are in a car.  Talk with your doctor if someone is hurting you or yelling at you a lot. Preparing for your baby's arrival To prepare for the arrival of your baby:  Take prenatal classes.  Visit the hospital and tour the maternity area.  Buy a rear-facing car seat. Learn how to install it in your car.  Prepare the baby's room. Take out all pillows and stuffed animals from the baby's crib. General instructions  Avoid cat litter boxes and soil used by cats. These carry germs that can cause harm to the baby and can cause a loss of your baby by miscarriage or stillbirth.  Do not douche or use tampons. Do not use scented sanitary pads.  Do not smoke or use any products that contain nicotine or tobacco. If you need help quitting, ask your doctor.  Do not drink alcohol.  Do not use herbal medicines, illegal drugs, or medicines that were not approved by your doctor. Chemicals in these products can affect your baby.  Keep all follow-up visits. This is important. Where to find more information  American Pregnancy Association: americanpregnancy.org  [employer] of  Obstetricians and Gynecologists: www.acog.org  Office on Women's Health: [employer]co.nz Contact a doctor if:  You have a fever.  You have mild cramps or pressure in your lower belly.  You have a nagging pain in your belly area.  You vomit, or you have watery poop (diarrhea).  You have bad-smelling fluid coming from your vagina.  You have pain when you pee, or your pee smells bad.  You have a headache that does not go away  when you take medicine.  You have changes in how you see, or you see spots in front of your eyes. Get help right away if:  Your water breaks.  You have regular contractions that are less than 5 minutes apart.  You are spotting or bleeding from your vagina.  You have very bad belly cramps or pain.  You have trouble breathing.  You have chest pain.  You faint.  You have not felt the baby move for the amount of time told by your doctor.  You have new or increased pain, swelling, or redness in an arm or leg. Summary  The third trimester is from week 28 through week 40 (months 7 through 9). This is the time when your unborn baby is growing very fast.  During this time, your discomfort may increase as you gain weight and as your baby grows.  Get ready for your baby to arrive by taking prenatal classes, buying a rear-facing car seat, and preparing the baby's room.  Get help right away if you are bleeding from your vagina, you have chest pain and trouble breathing, or you have not felt the baby move for the amount of time told by your doctor. This information is not intended to replace advice given to you by your health care provider. Make sure you discuss any questions you have with your health care provider. Document Revised: 10/23/2019 Document Reviewed: 08/29/2019 Elsevier Patient Education  2021 [employer].

## 2020-09-16 NOTE — Progress Notes (Signed)
   PRENATAL VISIT NOTE  Subjective:  Anna [name] is a 30 y.o. [phone] at [redacted]w[redacted]d being seen today for ongoing prenatal care.  She is currently monitored for the following issues for this high-risk pregnancy and has ASCUS of cervix with negative high risk HPV; Supervision of high risk pregnancy, antepartum; History of preterm delivery; Dichorionic diamniotic twin gestation; and Heartburn in pregnancy in second trimester on their problem list.  Patient reports feeling a lot of pressure from heaviness of her uterus, otherwise no complaints.  Contractions: Not present. Vag. Bleeding: None.  Movement: Present. Denies leaking of fluid.   The following portions of the patient's history were reviewed and updated as appropriate: allergies, current medications, past family history, past medical history, past social history, past surgical history and problem list.   Objective:   Vitals:   09/16/20 0838  BP: 121/68  Pulse: 93  Weight: 175 lb (79.4 kg)    Fetal Status: Fetal Heart Rate (bpm): 138/150 Fundal Height: 44 cm Movement: Present     General:  Alert, oriented and cooperative. Patient is in no acute distress.  Skin: Skin is warm and dry. No rash noted.   Cardiovascular: Normal heart rate noted  Respiratory: Normal respiratory effort, no problems with respiration noted  Abdomen: Soft, gravid, appropriate for gestational age.  Pain/Pressure: Present     Pelvic: Cervical exam deferred        Extremities: Normal range of motion.  Edema: None  Mental Status: Normal mood and affect. Normal behavior. Normal judgment and thought content.   Assessment and Plan:  Pregnancy: [id number] at [redacted]w[redacted]d 1. Supervision of high risk pregnancy, antepartum - Doing well, feeling vigorous fetal movement  2. Dichorionic diamniotic twin pregnancy in third trimester - Twin A is head down, will see position of Twin B at today's ultrasound. - Pt desires vaginal delivery, suggested visit with Dr Shawnie Pons next to discuss  twin vaginal delivery - Discussed ways to relieve pressure and back pain including stretches, use of sheet for abdominal lifts and wearing either a pregnancy belt or posture correction for upper back (pt currently using intermittently and says it works well)  3. [redacted] weeks gestation of pregnancy - Routine OB care - Tdap vaccine greater than or equal to 7yo IM  Preterm labor symptoms and general obstetric precautions including but not limited to vaginal bleeding, contractions, leaking of fluid and fetal movement were reviewed in detail with the patient. Please refer to After Visit Summary for other counseling recommendations.   Return in about 2 weeks (around 09/30/2020) for IN-PERSON, [facility].  Future Appointments  Date Time Provider Department Center  09/16/2020  9:30 AM [facility] NURSE [facility]  09/16/2020  9:45 AM [facility] US5 [facility]    Bernerd Limbo, CNM

## 2020-10-01 ENCOUNTER — Encounter: Payer: Self-pay | Admitting: Obstetrics and Gynecology

## 2020-10-01 ENCOUNTER — Ambulatory Visit (INDEPENDENT_AMBULATORY_CARE_PROVIDER_SITE_OTHER): Payer: Medicaid Other | Admitting: Obstetrics and Gynecology

## 2020-10-01 ENCOUNTER — Other Ambulatory Visit: Payer: Medicaid Other

## 2020-10-01 ENCOUNTER — Other Ambulatory Visit: Payer: Self-pay

## 2020-10-01 VITALS — BP 115/75 | HR 78 | Wt 173.9 lb

## 2020-10-01 DIAGNOSIS — R87610 Atypical squamous cells of undetermined significance on cytologic smear of cervix (ASC-US): Secondary | ICD-10-CM

## 2020-10-01 DIAGNOSIS — O0990 Supervision of high risk pregnancy, unspecified, unspecified trimester: Secondary | ICD-10-CM

## 2020-10-01 DIAGNOSIS — Z8751 Personal history of pre-term labor: Secondary | ICD-10-CM

## 2020-10-01 DIAGNOSIS — O30043 Twin pregnancy, dichorionic/diamniotic, third trimester: Secondary | ICD-10-CM

## 2020-10-01 NOTE — Patient Instructions (Signed)

## 2020-10-01 NOTE — Progress Notes (Signed)
Subjective:  Anna [name] is a 30 y.o. [phone] at [redacted]w[redacted]d being seen today for ongoing prenatal care.  She is currently monitored for the following issues for this high-risk pregnancy and has ASCUS of cervix with negative high risk HPV; Supervision of high risk pregnancy, antepartum; History of preterm delivery; Dichorionic diamniotic twin gestation; and Heartburn in pregnancy in second trimester on their problem list.  Patient reports general discomforts of twin gestation.  Contractions: Irritability. Vag. Bleeding: None.  Movement: Present. Denies leaking of fluid.   The following portions of the patient's history were reviewed and updated as appropriate: allergies, current medications, past family history, past medical history, past social history, past surgical history and problem list. Problem list updated.  Objective:   Vitals:   10/01/20 0857  BP: 115/75  Pulse: 78  Weight: 173 lb 14.4 oz (78.9 kg)    Fetal Status: Fetal Heart Rate (bpm): 141/152   Movement: Present     General:  Alert, oriented and cooperative. Patient is in no acute distress.  Skin: Skin is warm and dry. No rash noted.   Cardiovascular: Normal heart rate noted  Respiratory: Normal respiratory effort, no problems with respiration noted  Abdomen: Soft, gravid, appropriate for gestational age. Pain/Pressure: Present     Pelvic:  Cervical exam performed        Extremities: Normal range of motion.  Edema: None  Mental Status: Normal mood and affect. Normal behavior. Normal judgment and thought content.   Urinalysis:      Assessment and Plan:  Pregnancy: [id number] at [redacted]w[redacted]d  1. Supervision of high risk pregnancy, antepartum Stable  2. Dichorionic diamniotic twin pregnancy in third trimester Stable Growth scan next week  3. History of preterm delivery Stable   4. ASCUS of cervix with negative high risk HPV Colpo vs repeat pap at [facility] visit  Preterm labor symptoms and general obstetric precautions including but  not limited to vaginal bleeding, contractions, leaking of fluid and fetal movement were reviewed in detail with the patient. Please refer to After Visit Summary for other counseling recommendations.  Return in about 2 weeks (around 10/15/2020) for OB visit, face to face, MD only.   Hermina Staggers, MD

## 2020-10-02 LAB — CBC
Hematocrit: 34.2 % (ref 34.0–46.6)
Hemoglobin: 10.9 g/dL — ABNORMAL LOW (ref 11.1–15.9)
MCH: 31 pg (ref 26.6–33.0)
MCHC: 31.9 g/dL (ref 31.5–35.7)
MCV: 97 fL (ref 79–97)
Platelets: 139 10*3/uL — ABNORMAL LOW (ref 150–450)
RBC: 3.52 x10E6/uL — ABNORMAL LOW (ref 3.77–5.28)
RDW: 12.4 % (ref 11.7–15.4)
WBC: 7.1 10*3/uL (ref 3.4–10.8)

## 2020-10-02 LAB — GLUCOSE TOLERANCE, 2 HOURS W/ 1HR
Glucose, 1 hour: 112 mg/dL (ref 65–179)
Glucose, 2 hour: 107 mg/dL (ref 65–152)
Glucose, Fasting: 73 mg/dL (ref 65–91)

## 2020-10-02 LAB — HIV ANTIBODY (ROUTINE TESTING W REFLEX): HIV Screen 4th Generation wRfx: NONREACTIVE

## 2020-10-02 LAB — RPR: RPR Ser Ql: NONREACTIVE

## 2020-10-07 ENCOUNTER — Other Ambulatory Visit: Payer: Self-pay | Admitting: *Deleted

## 2020-10-07 ENCOUNTER — Other Ambulatory Visit: Payer: Self-pay | Admitting: Obstetrics

## 2020-10-07 ENCOUNTER — Ambulatory Visit: Payer: Medicaid Other | Admitting: *Deleted

## 2020-10-07 ENCOUNTER — Ambulatory Visit: Payer: Medicaid Other | Attending: Obstetrics

## 2020-10-07 ENCOUNTER — Other Ambulatory Visit: Payer: Self-pay

## 2020-10-07 ENCOUNTER — Encounter: Payer: Self-pay | Admitting: *Deleted

## 2020-10-07 DIAGNOSIS — Z8751 Personal history of pre-term labor: Secondary | ICD-10-CM | POA: Insufficient documentation

## 2020-10-07 DIAGNOSIS — Z3A34 34 weeks gestation of pregnancy: Secondary | ICD-10-CM | POA: Diagnosis not present

## 2020-10-07 DIAGNOSIS — Z148 Genetic carrier of other disease: Secondary | ICD-10-CM

## 2020-10-07 DIAGNOSIS — O321XX1 Maternal care for breech presentation, fetus 1: Secondary | ICD-10-CM | POA: Diagnosis not present

## 2020-10-07 DIAGNOSIS — O30043 Twin pregnancy, dichorionic/diamniotic, third trimester: Secondary | ICD-10-CM | POA: Diagnosis not present

## 2020-10-07 DIAGNOSIS — O365932 Maternal care for other known or suspected poor fetal growth, third trimester, fetus 2: Secondary | ICD-10-CM

## 2020-10-07 DIAGNOSIS — O321XX2 Maternal care for breech presentation, fetus 2: Secondary | ICD-10-CM

## 2020-10-07 DIAGNOSIS — O0990 Supervision of high risk pregnancy, unspecified, unspecified trimester: Secondary | ICD-10-CM

## 2020-10-07 DIAGNOSIS — Z363 Encounter for antenatal screening for malformations: Secondary | ICD-10-CM | POA: Diagnosis not present

## 2020-10-15 ENCOUNTER — Encounter: Payer: Self-pay | Admitting: *Deleted

## 2020-10-15 ENCOUNTER — Other Ambulatory Visit: Payer: Self-pay

## 2020-10-15 ENCOUNTER — Ambulatory Visit: Payer: Medicaid Other | Admitting: *Deleted

## 2020-10-15 ENCOUNTER — Ambulatory Visit (INDEPENDENT_AMBULATORY_CARE_PROVIDER_SITE_OTHER): Payer: Medicaid Other | Admitting: Obstetrics and Gynecology

## 2020-10-15 ENCOUNTER — Ambulatory Visit: Payer: Medicaid Other | Attending: Obstetrics

## 2020-10-15 VITALS — BP 116/70 | HR 83 | Wt 175.0 lb

## 2020-10-15 DIAGNOSIS — Z8751 Personal history of pre-term labor: Secondary | ICD-10-CM | POA: Insufficient documentation

## 2020-10-15 DIAGNOSIS — Z3A35 35 weeks gestation of pregnancy: Secondary | ICD-10-CM

## 2020-10-15 DIAGNOSIS — O365932 Maternal care for other known or suspected poor fetal growth, third trimester, fetus 2: Secondary | ICD-10-CM

## 2020-10-15 DIAGNOSIS — O0990 Supervision of high risk pregnancy, unspecified, unspecified trimester: Secondary | ICD-10-CM

## 2020-10-15 DIAGNOSIS — O321XX2 Maternal care for breech presentation, fetus 2: Secondary | ICD-10-CM | POA: Diagnosis not present

## 2020-10-15 DIAGNOSIS — Z148 Genetic carrier of other disease: Secondary | ICD-10-CM | POA: Diagnosis not present

## 2020-10-15 DIAGNOSIS — O321XX1 Maternal care for breech presentation, fetus 1: Secondary | ICD-10-CM

## 2020-10-15 DIAGNOSIS — O30043 Twin pregnancy, dichorionic/diamniotic, third trimester: Secondary | ICD-10-CM | POA: Insufficient documentation

## 2020-10-15 DIAGNOSIS — O358XX2 Maternal care for other (suspected) fetal abnormality and damage, fetus 2: Secondary | ICD-10-CM | POA: Diagnosis not present

## 2020-10-15 DIAGNOSIS — R87610 Atypical squamous cells of undetermined significance on cytologic smear of cervix (ASC-US): Secondary | ICD-10-CM

## 2020-10-15 NOTE — Progress Notes (Signed)
   PRENATAL VISIT NOTE  Subjective:  Anna [name] is a 30 y.o. [phone] at [redacted]w[redacted]d being seen today for ongoing prenatal care.  She is currently monitored for the following issues for this high-risk pregnancy and has ASCUS of cervix with negative high risk HPV; Supervision of high risk pregnancy, antepartum; History of preterm delivery; Dichorionic diamniotic twin gestation; Heartburn in pregnancy in second trimester; and [redacted] weeks gestation of pregnancy on their problem list.  Patient doing well with no acute concerns today. She reports pelvic pressure, sciatica, back pain.  Contractions: Regular. Vag. Bleeding: None.  Movement: Present. Denies leaking of fluid.   The following portions of the patient's history were reviewed and updated as appropriate: allergies, current medications, past family history, past medical history, past social history, past surgical history and problem list. Problem list updated.  Objective:   Vitals:   10/15/20 0902  BP: 116/70  Pulse: 83  Weight: 175 lb (79.4 kg)    Fetal Status: Fetal Heart Rate (bpm): 137/138 Fundal Height: 44 cm Movement: Present     General:  Alert, oriented and cooperative. Patient is in no acute distress.  Skin: Skin is warm and dry. No rash noted.   Cardiovascular: Normal heart rate noted  Respiratory: Normal respiratory effort, no problems with respiration noted  Abdomen: Soft, gravid, appropriate for gestational age.  Pain/Pressure: Present     Pelvic: Cervical exam deferred        Extremities: Normal range of motion.  Edema: None  Mental Status:  Normal mood and affect. Normal behavior. Normal judgment and thought content.   Assessment and Plan:  Pregnancy: [id number] at 104w3d  1. Supervision of high risk pregnancy, antepartum Routine visit today  2. [redacted] weeks gestation of pregnancy   3. History of preterm delivery No s/sx of preterm labor  4. Dichorionic diamniotic twin pregnancy in third trimester Per MFM 1 twin has growth  restriction, last scan was br/br.  Evaluating for vaginal delivery versus primary c section, delivery was recommended at 37 weeks, pt has BPP with dopplers today and position will be reassessed  5. ASCUS of cervix with negative high risk HPV   Preterm labor symptoms and general obstetric precautions including but not limited to vaginal bleeding, contractions, leaking of fluid and fetal movement were reviewed in detail with the patient.  Please refer to After Visit Summary for other counseling recommendations.   Return in about 1 week (around 10/22/2020) for [facility], in person, 36 weeks swabs.   Mariel Aloe, MD Faculty Attending Center for [facility]

## 2020-10-19 ENCOUNTER — Inpatient Hospital Stay (HOSPITAL_COMMUNITY)
Admission: AD | Admit: 2020-10-19 | Discharge: 2020-10-20 | Disposition: A | Discharge status: Home / Self Care | Payer: Medicaid Other | Attending: Obstetrics & Gynecology | Admitting: Obstetrics & Gynecology

## 2020-10-19 ENCOUNTER — Encounter (HOSPITAL_COMMUNITY): Payer: Self-pay | Admitting: Obstetrics and Gynecology

## 2020-10-19 ENCOUNTER — Inpatient Hospital Stay (HOSPITAL_BASED_OUTPATIENT_CLINIC_OR_DEPARTMENT_OTHER): Payer: Medicaid Other

## 2020-10-19 DIAGNOSIS — O283 Abnormal ultrasonic finding on antenatal screening of mother: Secondary | ICD-10-CM

## 2020-10-19 DIAGNOSIS — O4693 Antepartum hemorrhage, unspecified, third trimester: Secondary | ICD-10-CM | POA: Insufficient documentation

## 2020-10-19 DIAGNOSIS — O30043 Twin pregnancy, dichorionic/diamniotic, third trimester: Secondary | ICD-10-CM | POA: Diagnosis not present

## 2020-10-19 DIAGNOSIS — O09293 Supervision of pregnancy with other poor reproductive or obstetric history, third trimester: Secondary | ICD-10-CM | POA: Diagnosis not present

## 2020-10-19 DIAGNOSIS — O4703 False labor before 37 completed weeks of gestation, third trimester: Secondary | ICD-10-CM

## 2020-10-19 DIAGNOSIS — Z8751 Personal history of pre-term labor: Secondary | ICD-10-CM

## 2020-10-19 DIAGNOSIS — Z3A36 36 weeks gestation of pregnancy: Secondary | ICD-10-CM

## 2020-10-19 DIAGNOSIS — O30003 Twin pregnancy, unspecified number of placenta and unspecified number of amniotic sacs, third trimester: Secondary | ICD-10-CM

## 2020-10-19 DIAGNOSIS — O09213 Supervision of pregnancy with history of pre-term labor, third trimester: Secondary | ICD-10-CM | POA: Diagnosis not present

## 2020-10-19 DIAGNOSIS — O0990 Supervision of high risk pregnancy, unspecified, unspecified trimester: Secondary | ICD-10-CM

## 2020-10-19 DIAGNOSIS — Z7982 Long term (current) use of aspirin: Secondary | ICD-10-CM | POA: Insufficient documentation

## 2020-10-19 DIAGNOSIS — O4700 False labor before 37 completed weeks of gestation, unspecified trimester: Secondary | ICD-10-CM | POA: Diagnosis present

## 2020-10-19 MED ORDER — TERBUTALINE SULFATE 1 MG/ML IJ SOLN
0.2500 mg | Freq: Once | INTRAMUSCULAR | Status: AC
  Administered 2020-10-19: 0.25 mg via SUBCUTANEOUS
  Filled 2020-10-19: qty 1

## 2020-10-19 NOTE — MAU Note (Signed)
PT SAYS SHE HAS TWINS-   SAYS SR[facility]- 730PM- CLEAR. [facility] WITH CLINIC. PLAN IS VAG- A WAS VERTEX.  NO VE IN OFFICE -  DENIES HSV  GBS- NOT COLLECTED FEELS REG UC'S

## 2020-10-19 NOTE — MAU Provider Note (Signed)
Chief Complaint:  Rupture of Membranes   Event Date/Time   First Provider Initiated Contact with Patient 10/19/20 2131      HPI: Anna [name] is a 30 y.o. [id number] at 42w0dwho presents to maternity admissions reporting leaking of fluid.  Also having some painful contractions. . She reports good fetal movement, denies vaginal bleeding, vaginal itching/burning, urinary symptoms, h/a, dizziness, n/v, diarrhea, constipation or fever/chills.  .  Vaginal Discharge The patient's primary symptoms include pelvic pain and vaginal discharge. The patient's pertinent negatives include no genital itching, genital lesions, genital odor or vaginal bleeding. This is a new problem. The current episode started today. The problem occurs intermittently. The problem has been unchanged. The pain is moderate. She is pregnant. Associated symptoms include abdominal pain and back pain. Pertinent negatives include no constipation, diarrhea, dysuria, fever, nausea or vomiting. The vaginal discharge was clear and watery. There has been no bleeding. She has not been passing clots. She has not been passing tissue. Nothing aggravates the symptoms. She has tried nothing for the symptoms.   RN Note: PT SAYS SHE HAS TWINS-   SAYS SROM- 730PM- CLEAR. [facility]. PLAN IS VAG- A WAS VERTEX.  NO VE IN OFFICE -  DENIES HSV  GBS- NOT COLLECTED FEELS REG UC'S   Past Medical History: Past Medical History:  Diagnosis Date  . Chronic headaches   . Insomnia   . Medical history non-contributory     Past obstetric history: OB History  Gravida Para Term Preterm AB Living  5 3 2 1 1 3   SAB IAB Ectopic Multiple Live Births  0 1 0 0 3    # Outcome Date GA Lbr Len/2nd Weight Sex Delivery Anes PTL Lv  5 Current           4 Term 06/15/17 [redacted]w[redacted]d 12:18 / 00:12 3765 g F Vag-Spont EPI  LIV     Birth Comments: WNL  3 Term 02/27/15 [redacted]w[redacted]d  3544 g F Vag-Spont EPI N LIV  2 Preterm 07/09/11 [redacted]w[redacted]d  2325 g F Vag-Spont EPI Y LIV  1 IAB  02/27/09            Past Surgical History: Past Surgical History:  Procedure Laterality Date  . NO PAST SURGERIES      Family History: Family History  Problem Relation Age of Onset  . Depression Mother   . Depression Father     Social History: Social History   Tobacco Use  . Smoking status: Never Smoker  . Smokeless tobacco: Never Used  Vaping Use  . Vaping Use: Some days  . Start date: 02/06/2018  . Substances: Nicotine  . Devices: Stig   Substance Use Topics  . Alcohol use: No  . Drug use: No    Allergies:  Allergies  Allergen Reactions  . Ciprofloxacin Hcl Anaphylaxis    Meds:  Medications Prior to Admission  Medication Sig Dispense Refill Last Dose  . aspirin EC 81 MG tablet Take 1 tablet (81 mg total) by mouth daily. Take after 12 weeks for prevention of preeclampsia later in pregnancy 300 tablet 2 Past Month at Unknown time  . Prenatal Vit-Fe Fumarate-FA (MULTIVITAMIN-PRENATAL) 27-0.8 MG TABS tablet Take 1 tablet by mouth daily at 12 noon.   10/17/2020    I have reviewed patient's Past Medical Hx, Surgical Hx, Family Hx, Social Hx, medications and allergies.   ROS:  Review of Systems  Constitutional: Negative for fever.  Gastrointestinal: Positive for abdominal pain. Negative for constipation, diarrhea, nausea  and vomiting.  Genitourinary: Positive for pelvic pain and vaginal discharge. Negative for dysuria.  Musculoskeletal: Positive for back pain.   Other systems negative  Physical Exam   Patient Vitals for the past 24 hrs:  BP Temp Temp src Pulse Resp Height Weight  10/19/20 2055 112/75 98 F (36.7 C) Oral 92 20 5\' 9"  (1.753 m) 77.6 kg   Constitutional: Well-developed, well-nourished female in no acute distress.  Cardiovascular: normal rate and rhythm Respiratory: normal effort, clear to auscultation bilaterally GI: Abd soft, non-tender, gravid appropriate for gestational age.   No rebound or guarding. MS: Extremities nontender, no edema,  normal ROM Neurologic: Alert and oriented x 4.  GU: Neg CVAT.  PELVIC EXAM: Cervix pink, visually closed, without lesion, scant white creamy discharge, vaginal walls and external genitalia normal  Dilation: 1 Effacement (%): Thick Cervical Position: Posterior Station: Ballotable Presentation: Transverse Exam by:: Jesalyn Finazzo,cnm   FHT:  Baseline 130/140 , moderate variability, accelerations present, no decelerations Contractions: q 3-5 mins Irregular     Labs: No results found for this or any previous visit (from the past 24 hour(s)).  O/Positive/-- (11/30 1034)  Imaging:  Ordered to check presentation. My bedside 01-21-2000 was inconclusive for twin A They saw both twins transverse, head to head in center Normal Amniotic fluid  Normal placentas  MAU Course/MDM: NST reviewed, reassuring Consult Dr [place] with presentation, exam findings and test results.  He recommends a dose of Terbutaline which did stop her contractions.  Recheck of cervix unchanged. He instructed Despina Hidden not to give BMZ  Assessment: 1. Supervision of high risk pregnancy, antepartum   2. History of preterm delivery     Plan: Discharge home Preterm Labor precautions and fetal kick counts Follow up in Office for prenatal visits and recheck of cervix  Encouraged to return if she develops worsening of symptoms, increase in pain, fever, or other concerning symptoms.   Pt stable at time of discharge.  [place] CNM, MSN Certified Nurse-Midwife 10/19/2020 9:31 PM

## 2020-10-20 DIAGNOSIS — Z7982 Long term (current) use of aspirin: Secondary | ICD-10-CM | POA: Diagnosis not present

## 2020-10-20 DIAGNOSIS — Z3A36 36 weeks gestation of pregnancy: Secondary | ICD-10-CM | POA: Diagnosis not present

## 2020-10-20 DIAGNOSIS — O30043 Twin pregnancy, dichorionic/diamniotic, third trimester: Secondary | ICD-10-CM | POA: Diagnosis not present

## 2020-10-20 DIAGNOSIS — O4693 Antepartum hemorrhage, unspecified, third trimester: Secondary | ICD-10-CM | POA: Diagnosis not present

## 2020-10-20 DIAGNOSIS — O322XX2 Maternal care for transverse and oblique lie, fetus 2: Secondary | ICD-10-CM

## 2020-10-20 DIAGNOSIS — O09213 Supervision of pregnancy with history of pre-term labor, third trimester: Secondary | ICD-10-CM | POA: Diagnosis not present

## 2020-10-20 DIAGNOSIS — O321XX1 Maternal care for breech presentation, fetus 1: Secondary | ICD-10-CM | POA: Diagnosis not present

## 2020-10-20 NOTE — Discharge Instructions (Signed)
Preterm Labor The normal length of a pregnancy is 39-41 weeks. Preterm labor is when labor starts before 37 completed weeks of pregnancy. Babies who are born prematurely and survive may not be fully developed and may be at an increased risk for long-term problems such as cerebral palsy, developmental delays, and vision and hearing problems. Babies who are born too early may have problems soon after birth. Problems may include regulating blood sugar, body temperature, heart rate, and breathing rate. These babies often have trouble with feeding. The risk of having problems is highest for babies who are born before 34 weeks of pregnancy. What are the causes? The exact cause of this condition is not known. What increases the risk? You are more likely to have preterm labor if you have certain risk factors that relate to your medical history, problems with present and past pregnancies, and lifestyle factors. Medical history  You have abnormalities of the uterus, including a short cervix.  You have STIs (sexually transmitted infections), or other infections of the urinary tract and the vagina.  You have chronic illnesses, such as blood clotting problems, diabetes, or high blood pressure.  You are overweight or underweight. Present and past pregnancies  You have had preterm labor before.  You are pregnant with twins or other multiples.  You have been diagnosed with a condition in which the placenta covers your cervix (placenta previa).  You waited less than 6 months between giving birth and becoming pregnant again.  Your unborn baby has some abnormalities.  You have vaginal bleeding during pregnancy.  You became pregnant through in vitro fertilization (IVF). Lifestyle and environmental factors  You use tobacco products.  You drink alcohol.  You use street drugs.  You have stress and no social support.  You experience domestic violence.  You are exposed to certain chemicals or  environmental pollutants. Other factors  You are younger than age 17 or older than age 35. What are the signs or symptoms? Symptoms of this condition include:  Cramps similar to those that can happen during a menstrual period. The cramps may happen with diarrhea.  Pain in the abdomen or lower back.  Regular contractions that may feel like tightening of the abdomen.  A feeling of increased pressure in the pelvis.  Increased watery or bloody mucus discharge from the vagina.  Water breaking (ruptured amniotic sac). How is this diagnosed? This condition is diagnosed based on:  Your medical history and a physical exam.  A pelvic exam.  An ultrasound.  Monitoring your uterus for contractions.  Other tests, including: ? A swab of the cervix to check for a chemical called fetal fibronectin. ? Urine tests. How is this treated? Treatment for this condition depends on the length of your pregnancy, your condition, and the health of your baby. Treatment may include:  Taking medicines, such as: ? Hormone medicines. These may be given early in pregnancy to help support the pregnancy. ? Medicines to stop contractions. ? Medicines to help mature the baby's lungs. These may be prescribed if the risk of delivery is high. ? Medicines to prevent your baby from developing cerebral palsy.  Bed rest. If the labor happens before 34 weeks of pregnancy, you may need to stay in the hospital.  Delivery of the baby. Follow these instructions at home:  Do not use any products that contain nicotine or tobacco, such as cigarettes, e-cigarettes, and chewing tobacco. If you need help quitting, ask your health care provider.  Do not drink alcohol.    Take over-the-counter and prescription medicines only as told by your health care provider.  Rest as told by your health care provider.  Return to your normal activities as told by your health care provider. Ask your health care provider what activities  are safe for you.  Keep all follow-up visits as told by your health care provider. This is important.   How is this prevented? To increase your chance of having a full-term pregnancy:  Do not use street drugs or medicines that have not been prescribed to you during your pregnancy.  Talk with your health care provider before taking any herbal supplements, even if you have been taking them regularly.  Make sure you gain a healthy amount of weight during your pregnancy.  Watch for infection. If you think that you might have an infection, get it checked right away. Symptoms of infection may include: ? Fever. ? Abnormal vaginal discharge or discharge that smells bad. ? Pain or burning with urination. ? Needing to urinate urgently. ? Frequently urinating or passing small amounts of urine frequently. ? Blood in your urine. ? Urine that smells bad or unusual.  Tell your health care provider if you have had preterm labor before. Contact a health care provider if:  You think you are going into preterm labor.  You have signs or symptoms of preterm labor.  You have symptoms of infection. Get help right away if:  You are having regular, painful contractions every 5 minutes or less.  Your water breaks. Summary  Preterm labor is labor that starts before you reach 37 weeks of pregnancy.  Delivering your baby early increases your baby's risk of developing lifelong problems.  The exact cause of preterm labor is unknown. However, having an abnormal uterus, an STI (sexually transmitted infection), or vaginal bleeding during pregnancy increases your risk for preterm labor.  Keep all follow-up visits as told by your health care provider. This is important.  Contact a health care provider if you have signs or symptoms of preterm labor. This information is not intended to replace advice given to you by your health care provider. Make sure you discuss any questions you have with your health care  provider. Document Revised: 06/18/2019 Document Reviewed: 06/18/2019 Elsevier Patient Education  2021 [employer]  

## 2020-10-21 ENCOUNTER — Ambulatory Visit: Payer: Medicaid Other | Admitting: *Deleted

## 2020-10-21 ENCOUNTER — Other Ambulatory Visit: Payer: Self-pay

## 2020-10-21 ENCOUNTER — Other Ambulatory Visit: Payer: Self-pay | Admitting: Obstetrics

## 2020-10-21 ENCOUNTER — Encounter: Payer: Self-pay | Admitting: *Deleted

## 2020-10-21 ENCOUNTER — Ambulatory Visit: Payer: Medicaid Other | Attending: Obstetrics

## 2020-10-21 DIAGNOSIS — O365932 Maternal care for other known or suspected poor fetal growth, third trimester, fetus 2: Secondary | ICD-10-CM | POA: Diagnosis not present

## 2020-10-21 DIAGNOSIS — O358XX2 Maternal care for other (suspected) fetal abnormality and damage, fetus 2: Secondary | ICD-10-CM | POA: Diagnosis not present

## 2020-10-21 DIAGNOSIS — O30043 Twin pregnancy, dichorionic/diamniotic, third trimester: Secondary | ICD-10-CM

## 2020-10-21 DIAGNOSIS — Z8751 Personal history of pre-term labor: Secondary | ICD-10-CM | POA: Diagnosis not present

## 2020-10-21 DIAGNOSIS — O0990 Supervision of high risk pregnancy, unspecified, unspecified trimester: Secondary | ICD-10-CM | POA: Insufficient documentation

## 2020-10-21 DIAGNOSIS — Z148 Genetic carrier of other disease: Secondary | ICD-10-CM

## 2020-10-21 DIAGNOSIS — O321XX1 Maternal care for breech presentation, fetus 1: Secondary | ICD-10-CM | POA: Diagnosis not present

## 2020-10-21 DIAGNOSIS — Z3A36 36 weeks gestation of pregnancy: Secondary | ICD-10-CM

## 2020-10-21 DIAGNOSIS — O322XX2 Maternal care for transverse and oblique lie, fetus 2: Secondary | ICD-10-CM | POA: Diagnosis not present

## 2020-10-21 NOTE — Procedures (Signed)
Anna [name] 01-28-1991 [redacted]w[redacted]d  Fetus A Non-Stress Test Interpretation for 10/21/20  Indication: Anna [name] 01/16/1991 [redacted]w[redacted]d  Fetus A Non-Stress Test Interpretation for 10/21/20  Indication: Unsatisfactory BPP  Fetal Heart Rate A Mode: External Baseline Rate (A): 125 bpm Variability: Moderate Accelerations: 15 x 15 Decelerations: None Multiple birth?: Yes  Uterine Activity Mode: Palpation,Toco Contraction Frequency (min): UI Contraction Quality: Mild Resting Tone Palpated: Relaxed Resting Time: Adequate  Interpretation (Fetal Testing) Nonstress Test Interpretation: Reactive Comments: Dr. Parke Poisson reviewed tracing.  Di/Di twins, failed BPP  Fetal Heart Rate A Mode: External Baseline Rate (A): 125 bpm Variability: Moderate Accelerations: 15 x 15 Decelerations: None Multiple birth?: Yes  Uterine Activity Mode: Palpation,Toco Contraction Frequency (min): UI Contraction Quality: Mild Resting Tone Palpated: Relaxed Resting Time: Adequate  Interpretation (Fetal Testing) Nonstress Test Interpretation: Reactive Comments: Dr. Parke Poisson reviewed tracing.

## 2020-10-27 ENCOUNTER — Encounter (HOSPITAL_COMMUNITY): Payer: Self-pay

## 2020-10-27 ENCOUNTER — Encounter: Payer: Self-pay | Admitting: *Deleted

## 2020-10-27 ENCOUNTER — Other Ambulatory Visit: Payer: Self-pay

## 2020-10-27 ENCOUNTER — Other Ambulatory Visit
Admission: RE | Admit: 2020-10-27 | Discharge: 2020-10-27 | Disposition: A | Discharge status: Home / Self Care | Payer: Medicaid Other | Source: Ambulatory Visit | Attending: Family Medicine | Admitting: Family Medicine

## 2020-10-27 ENCOUNTER — Ambulatory Visit: Payer: Medicaid Other | Admitting: *Deleted

## 2020-10-27 ENCOUNTER — Encounter: Payer: Self-pay | Admitting: Family Medicine

## 2020-10-27 ENCOUNTER — Ambulatory Visit (INDEPENDENT_AMBULATORY_CARE_PROVIDER_SITE_OTHER): Payer: Medicaid Other | Admitting: Family Medicine

## 2020-10-27 ENCOUNTER — Ambulatory Visit (HOSPITAL_BASED_OUTPATIENT_CLINIC_OR_DEPARTMENT_OTHER): Payer: Medicaid Other

## 2020-10-27 ENCOUNTER — Encounter (HOSPITAL_COMMUNITY): Payer: Self-pay | Admitting: Obstetrics and Gynecology

## 2020-10-27 VITALS — BP 113/70 | HR 90 | Wt 179.0 lb

## 2020-10-27 DIAGNOSIS — Z8751 Personal history of pre-term labor: Secondary | ICD-10-CM | POA: Insufficient documentation

## 2020-10-27 DIAGNOSIS — O322XX2 Maternal care for transverse and oblique lie, fetus 2: Secondary | ICD-10-CM

## 2020-10-27 DIAGNOSIS — Z3689 Encounter for other specified antenatal screening: Secondary | ICD-10-CM

## 2020-10-27 DIAGNOSIS — O30043 Twin pregnancy, dichorionic/diamniotic, third trimester: Secondary | ICD-10-CM

## 2020-10-27 DIAGNOSIS — Z3A37 37 weeks gestation of pregnancy: Secondary | ICD-10-CM

## 2020-10-27 DIAGNOSIS — O0990 Supervision of high risk pregnancy, unspecified, unspecified trimester: Secondary | ICD-10-CM

## 2020-10-27 DIAGNOSIS — Z148 Genetic carrier of other disease: Secondary | ICD-10-CM | POA: Diagnosis not present

## 2020-10-27 DIAGNOSIS — R87610 Atypical squamous cells of undetermined significance on cytologic smear of cervix (ASC-US): Secondary | ICD-10-CM

## 2020-10-27 DIAGNOSIS — O365932 Maternal care for other known or suspected poor fetal growth, third trimester, fetus 2: Secondary | ICD-10-CM | POA: Diagnosis not present

## 2020-10-27 NOTE — Patient Instructions (Signed)
Anna [name]  10/27/2020   Your procedure is scheduled on:  10/28/2020  Arrive at 0715 at Entrance C on [address] at [facility]  and [employer]. You are invited to use the FREE valet parking or use the Visitor's parking deck.  Pick up the phone at the desk and dial [phone].  Call this number if you have problems the morning of surgery: [phone]  Remember:   Do not eat food:(After Midnight) Desps de medianoche.  Do not drink clear liquids: (After Midnight) Desps de medianoche.  Take these medicines the morning of surgery with A SIP OF WATER:  none   Do not wear jewelry, make-up or nail polish.  Do not wear lotions, powders, or perfumes. Do not wear deodorant.  Do not shave 48 hours prior to surgery.  Do not bring valuables to the hospital.  [facility] is not   responsible for any belongings or valuables brought to the hospital.  Contacts, dentures or bridgework may not be worn into surgery.  Leave suitcase in the car. After surgery it may be brought to your room.  For patients admitted to the hospital, checkout time is 11:00 AM the day of              discharge.      Please read over the following fact sheets that you were given:     Preparing for Surgery

## 2020-10-27 NOTE — Addendum Note (Signed)
Addended byVidal Schwalbe on: 10/27/2020 09:58 AM   Modules accepted: Orders

## 2020-10-27 NOTE — Patient Instructions (Signed)
 Contraception Choices Contraception, also called birth control, refers to methods or devices that prevent pregnancy. Hormonal methods Contraceptive implant A contraceptive implant is a thin, plastic tube that contains a hormone that prevents pregnancy. It is different from an intrauterine device (IUD). It is inserted into the upper part of the arm by a health care provider. Implants can be effective for up to 3 years. Progestin-only injections Progestin-only injections are injections of progestin, a synthetic form of the hormone progesterone. They are given every 3 months by a health care provider. Birth control pills Birth control pills are pills that contain hormones that prevent pregnancy. They must be taken once a day, preferably at the same time each day. A prescription is needed to use this method of contraception. Birth control patch The birth control patch contains hormones that prevent pregnancy. It is placed on the skin and must be changed once a week for three weeks and removed on the fourth week. A prescription is needed to use this method of contraception. Vaginal ring A vaginal ring contains hormones that prevent pregnancy. It is placed in the vagina for three weeks and removed on the fourth week. After that, the process is repeated with a new ring. A prescription is needed to use this method of contraception. Emergency contraceptive Emergency contraceptives prevent pregnancy after unprotected sex. They come in pill form and can be taken up to 5 days after sex. They work best the sooner they are taken after having sex. Most emergency contraceptives are available without a prescription. This method should not be used as your only form of birth control.   Barrier methods Female condom A female condom is a thin sheath that is worn over the penis during sex. Condoms keep sperm from going inside a woman's body. They can be used with a sperm-killing substance (spermicide) to increase their  effectiveness. They should be thrown away after one use. Female condom A female condom is a soft, loose-fitting sheath that is put into the vagina before sex. The condom keeps sperm from going inside a woman's body. They should be thrown away after one use. Diaphragm A diaphragm is a soft, dome-shaped barrier. It is inserted into the vagina before sex, along with a spermicide. The diaphragm blocks sperm from entering the uterus, and the spermicide kills sperm. A diaphragm should be left in the vagina for 6-8 hours after sex and removed within 24 hours. A diaphragm is prescribed and fitted by a health care provider. A diaphragm should be replaced every 1-2 years, after giving birth, after gaining more than 15 lb (6.8 kg), and after pelvic surgery. Cervical cap A cervical cap is a round, soft latex or plastic cup that fits over the cervix. It is inserted into the vagina before sex, along with spermicide. It blocks sperm from entering the uterus. The cap should be left in place for 6-8 hours after sex and removed within 48 hours. A cervical cap must be prescribed and fitted by a health care provider. It should be replaced every 2 years. Sponge A sponge is a soft, circular piece of polyurethane foam with spermicide in it. The sponge helps block sperm from entering the uterus, and the spermicide kills sperm. To use it, you make it wet and then insert it into the vagina. It should be inserted before sex, left in for at least 6 hours after sex, and removed and thrown away within 30 hours. Spermicides Spermicides are chemicals that kill or block sperm from entering the   cervix and uterus. They can come as a cream, jelly, suppository, foam, or tablet. A spermicide should be inserted into the vagina with an applicator at least 10-15 minutes before sex to allow time for it to work. The process must be repeated every time you have sex. Spermicides do not require a prescription.   Intrauterine  contraception Intrauterine device (IUD) An IUD is a T-shaped device that is put in a woman's uterus. There are two types:  Hormone IUD.This type contains progestin, a synthetic form of the hormone progesterone. This type can stay in place for 3-5 years.  Copper IUD.This type is wrapped in copper wire. It can stay in place for 10 years. Permanent methods of contraception Female tubal ligation In this method, a woman's fallopian tubes are sealed, tied, or blocked during surgery to prevent eggs from traveling to the uterus. Hysteroscopic sterilization In this method, a small, flexible insert is placed into each fallopian tube. The inserts cause scar tissue to form in the fallopian tubes and block them, so sperm cannot reach an egg. The procedure takes about 3 months to be effective. Another form of birth control must be used during those 3 months. Female sterilization This is a procedure to tie off the tubes that carry sperm (vasectomy). After the procedure, the man can still ejaculate fluid (semen). Another form of birth control must be used for 3 months after the procedure. Natural planning methods Natural family planning In this method, a couple does not have sex on days when the woman could become pregnant. Calendar method In this method, the woman keeps track of the length of each menstrual cycle, identifies the days when pregnancy can happen, and does not have sex on those days. Ovulation method In this method, a couple avoids sex during ovulation. Symptothermal method This method involves not having sex during ovulation. The woman typically checks for ovulation by watching changes in her temperature and in the consistency of cervical mucus. Post-ovulation method In this method, a couple waits to have sex until after ovulation. Where to find more information  Centers for Disease Control and Prevention: www.cdc.gov Summary  Contraception, also called birth control, refers to methods or  devices that prevent pregnancy.  Hormonal methods of contraception include implants, injections, pills, patches, vaginal rings, and emergency contraceptives.  Barrier methods of contraception can include female condoms, female condoms, diaphragms, cervical caps, sponges, and spermicides.  There are two types of IUDs (intrauterine devices). An IUD can be put in a woman's uterus to prevent pregnancy for 3-5 years.  Permanent sterilization can be done through a procedure for males and females. Natural family planning methods involve nothaving sex on days when the woman could become pregnant. This information is not intended to replace advice given to you by your health care provider. Make sure you discuss any questions you have with your health care provider. Document Revised: 10/21/2019 Document Reviewed: 10/21/2019 Elsevier Patient Education  2021 [employer] Inc.   Breastfeeding  Choosing to breastfeed is one of the best decisions you can make for yourself and your baby. A change in hormones during pregnancy causes your breasts to make breast milk in your milk-producing glands. Hormones prevent breast milk from being released before your baby is born. They also prompt milk flow after birth. Once breastfeeding has begun, thoughts of your baby, as well as his or her sucking or crying, can stimulate the release of milk from your milk-producing glands. Benefits of breastfeeding Research shows that breastfeeding offers many health benefits   for infants and mothers. It also offers a cost-free and convenient way to feed your baby. For your baby  Your first milk (colostrum) helps your baby's digestive system to function better.  Special cells in your milk (antibodies) help your baby to fight off infections.  Breastfed babies are less likely to develop asthma, allergies, obesity, or type 2 diabetes. They are also at lower risk for sudden infant death syndrome (SIDS).  Nutrients in breast milk are better  able to meet your baby's needs compared to infant formula.  Breast milk improves your baby's brain development. For you  Breastfeeding helps to create a very special bond between you and your baby.  Breastfeeding is convenient. Breast milk costs nothing and is always available at the correct temperature.  Breastfeeding helps to burn calories. It helps you to lose the weight that you gained during pregnancy.  Breastfeeding makes your uterus return faster to its size before pregnancy. It also slows bleeding (lochia) after you give birth.  Breastfeeding helps to lower your risk of developing type 2 diabetes, osteoporosis, rheumatoid arthritis, cardiovascular disease, and breast, ovarian, uterine, and endometrial cancer later in life. Breastfeeding basics Starting breastfeeding  Find a comfortable place to sit or lie down, with your neck and back well-supported.  Place a pillow or a rolled-up blanket under your baby to bring him or her to the level of your breast (if you are seated). Nursing pillows are specially designed to help support your arms and your baby while you breastfeed.  Make sure that your baby's tummy (abdomen) is facing your abdomen.  Gently massage your breast. With your fingertips, massage from the outer edges of your breast inward toward the nipple. This encourages milk flow. If your milk flows slowly, you may need to continue this action during the feeding.  Support your breast with 4 fingers underneath and your thumb above your nipple (make the letter "C" with your hand). Make sure your fingers are well away from your nipple and your baby's mouth.  Stroke your baby's lips gently with your finger or nipple.  When your baby's mouth is open wide enough, quickly bring your baby to your breast, placing your entire nipple and as much of the areola as possible into your baby's mouth. The areola is the colored area around your nipple. ? More areola should be visible above your  baby's upper lip than below the lower lip. ? Your baby's lips should be opened and extended outward (flanged) to ensure an adequate, comfortable latch. ? Your baby's tongue should be between his or her lower gum and your breast.  Make sure that your baby's mouth is correctly positioned around your nipple (latched). Your baby's lips should create a seal on your breast and be turned out (everted).  It is common for your baby to suck about 2-3 minutes in order to start the flow of breast milk. Latching Teaching your baby how to latch onto your breast properly is very important. An improper latch can cause nipple pain, decreased milk supply, and poor weight gain in your baby. Also, if your baby is not latched onto your nipple properly, he or she may swallow some air during feeding. This can make your baby fussy. Burping your baby when you switch breasts during the feeding can help to get rid of the air. However, teaching your baby to latch on properly is still the best way to prevent fussiness from swallowing air while breastfeeding. Signs that your baby has successfully latched onto   your nipple  Silent tugging or silent sucking, without causing you pain. Infant's lips should be extended outward (flanged).  Swallowing heard between every 3-4 sucks once your milk has started to flow (after your let-down milk reflex occurs).  Muscle movement above and in front of his or her ears while sucking. Signs that your baby has not successfully latched onto your nipple  Sucking sounds or smacking sounds from your baby while breastfeeding.  Nipple pain. If you think your baby has not latched on correctly, slip your finger into the corner of your baby's mouth to break the suction and place it between your baby's gums. Attempt to start breastfeeding again. Signs of successful breastfeeding Signs from your baby  Your baby will gradually decrease the number of sucks or will completely stop sucking.  Your baby  will fall asleep.  Your baby's body will relax.  Your baby will retain a small amount of milk in his or her mouth.  Your baby will let go of your breast by himself or herself. Signs from you  Breasts that have increased in firmness, weight, and size 1-3 hours after feeding.  Breasts that are softer immediately after breastfeeding.  Increased milk volume, as well as a change in milk consistency and color by the fifth day of breastfeeding.  Nipples that are not sore, cracked, or bleeding. Signs that your baby is getting enough milk  Wetting at least 1-2 diapers during the first 24 hours after birth.  Wetting at least 5-6 diapers every 24 hours for the first week after birth. The urine should be clear or pale yellow by the age of 5 days.  Wetting 6-8 diapers every 24 hours as your baby continues to grow and develop.  At least 3 stools in a 24-hour period by the age of 5 days. The stool should be soft and yellow.  At least 3 stools in a 24-hour period by the age of 7 days. The stool should be seedy and yellow.  No loss of weight greater than 10% of birth weight during the first 3 days of life.  Average weight gain of 4-7 oz (113-198 g) per week after the age of 4 days.  Consistent daily weight gain by the age of 5 days, without weight loss after the age of 2 weeks. After a feeding, your baby may spit up a small amount of milk. This is normal. Breastfeeding frequency and duration Frequent feeding will help you make more milk and can prevent sore nipples and extremely full breasts (breast engorgement). Breastfeed when you feel the need to reduce the fullness of your breasts or when your baby shows signs of hunger. This is called "breastfeeding on demand." Signs that your baby is hungry include:  Increased alertness, activity, or restlessness.  Movement of the head from side to side.  Opening of the mouth when the corner of the mouth or cheek is stroked (rooting).  Increased  sucking sounds, smacking lips, cooing, sighing, or squeaking.  Hand-to-mouth movements and sucking on fingers or hands.  Fussing or crying. Avoid introducing a pacifier to your baby in the first 4-6 weeks after your baby is born. After this time, you may choose to use a pacifier. Research has shown that pacifier use during the first year of a baby's life decreases the risk of sudden infant death syndrome (SIDS). Allow your baby to feed on each breast as long as he or she wants. When your baby unlatches or falls asleep while feeding from the   first breast, offer the second breast. Because newborns are often sleepy in the first few weeks of life, you may need to awaken your baby to get him or her to feed. Breastfeeding times will vary from baby to baby. However, the following rules can serve as a guide to help you make sure that your baby is properly fed:  Newborns (babies 4 weeks of age or younger) may breastfeed every 1-3 hours.  Newborns should not go without breastfeeding for longer than 3 hours during the day or 5 hours during the night.  You should breastfeed your baby a minimum of 8 times in a 24-hour period. Breast milk pumping Pumping and storing breast milk allows you to make sure that your baby is exclusively fed your breast milk, even at times when you are unable to breastfeed. This is especially important if you go back to work while you are still breastfeeding, or if you are not able to be present during feedings. Your lactation consultant can help you find a method of pumping that works best for you and give you guidelines about how long it is safe to store breast milk.      Caring for your breasts while you breastfeed Nipples can become dry, cracked, and sore while breastfeeding. The following recommendations can help keep your breasts moisturized and healthy:  Avoid using soap on your nipples.  Wear a supportive bra designed especially for nursing. Avoid wearing underwire-style  bras or extremely tight bras (sports bras).  Air-dry your nipples for 3-4 minutes after each feeding.  Use only cotton bra pads to absorb leaked breast milk. Leaking of breast milk between feedings is normal.  Use lanolin on your nipples after breastfeeding. Lanolin helps to maintain your skin's normal moisture barrier. Pure lanolin is not harmful (not toxic) to your baby. You may also hand express a few drops of breast milk and gently massage that milk into your nipples and allow the milk to air-dry. In the first few weeks after giving birth, some women experience breast engorgement. Engorgement can make your breasts feel heavy, warm, and tender to the touch. Engorgement peaks within 3-5 days after you give birth. The following recommendations can help to ease engorgement:  Completely empty your breasts while breastfeeding or pumping. You may want to start by applying warm, moist heat (in the shower or with warm, water-soaked hand towels) just before feeding or pumping. This increases circulation and helps the milk flow. If your baby does not completely empty your breasts while breastfeeding, pump any extra milk after he or she is finished.  Apply ice packs to your breasts immediately after breastfeeding or pumping, unless this is too uncomfortable for you. To do this: ? Put ice in a plastic bag. ? Place a towel between your skin and the bag. ? Leave the ice on for 20 minutes, 2-3 times a day.  Make sure that your baby is latched on and positioned properly while breastfeeding. If engorgement persists after 48 hours of following these recommendations, contact your health care provider or a lactation consultant. Overall health care recommendations while breastfeeding  Eat 3 healthy meals and 3 snacks every day. Well-nourished mothers who are breastfeeding need an additional 450-500 calories a day. You can meet this requirement by increasing the amount of a balanced diet that you eat.  Drink  enough water to keep your urine pale yellow or clear.  Rest often, relax, and continue to take your prenatal vitamins to prevent fatigue, stress, and low   vitamin and mineral levels in your body (nutrient deficiencies).  Do not use any products that contain nicotine or tobacco, such as cigarettes and e-cigarettes. Your baby may be harmed by chemicals from cigarettes that pass into breast milk and exposure to secondhand smoke. If you need help quitting, ask your health care provider.  Avoid alcohol.  Do not use illegal drugs or marijuana.  Talk with your health care provider before taking any medicines. These include over-the-counter and prescription medicines as well as vitamins and herbal supplements. Some medicines that may be harmful to your baby can pass through breast milk.  It is possible to become pregnant while breastfeeding. If birth control is desired, ask your health care provider about options that will be safe while breastfeeding your baby. Where to find more information: [employer]: www.llli.org Contact a health care provider if:  You feel like you want to stop breastfeeding or have become frustrated with breastfeeding.  Your nipples are cracked or bleeding.  Your breasts are red, tender, or warm.  You have: ? Painful breasts or nipples. ? A swollen area on either breast. ? A fever or chills. ? Nausea or vomiting. ? Drainage other than breast milk from your nipples.  Your breasts do not become full before feedings by the fifth day after you give birth.  You feel sad and depressed.  Your baby is: ? Too sleepy to eat well. ? Having trouble sleeping. ? More than 1 week old and wetting fewer than 6 diapers in a 24-hour period. ? Not gaining weight by 5 days of age.  Your baby has fewer than 3 stools in a 24-hour period.  Your baby's skin or the white parts of his or her eyes become yellow. Get help right away if:  Your baby is overly tired  (lethargic) and does not want to wake up and feed.  Your baby develops an unexplained fever. Summary  Breastfeeding offers many health benefits for infant and mothers.  Try to breastfeed your infant when he or she shows early signs of hunger.  Gently tickle or stroke your baby's lips with your finger or nipple to allow the baby to open his or her mouth. Bring the baby to your breast. Make sure that much of the areola is in your baby's mouth. Offer one side and burp the baby before you offer the other side.  Talk with your health care provider or lactation consultant if you have questions or you face problems as you breastfeed. This information is not intended to replace advice given to you by your health care provider. Make sure you discuss any questions you have with your health care provider. Document Revised: 08/10/2017 Document Reviewed: 06/17/2016 Elsevier Patient Education  2021 Elsevier Inc.  

## 2020-10-27 NOTE — Progress Notes (Signed)
   Subjective:  Anna [name] is a 30 y.o. [phone] at [redacted]w[redacted]d being seen today for ongoing prenatal care.  She is currently monitored for the following issues for this high-risk pregnancy and has ASCUS of cervix with negative high risk HPV; Supervision of high risk pregnancy, antepartum; History of preterm delivery; Dichorionic diamniotic twin gestation; Heartburn in pregnancy in second trimester; and [redacted] weeks gestation of pregnancy on their problem list.  Patient reports no complaints.  Contractions: Irritability. Vag. Bleeding: None.  Movement: Present. Denies leaking of fluid.   The following portions of the patient's history were reviewed and updated as appropriate: allergies, current medications, past family history, past medical history, past social history, past surgical history and problem list. Problem list updated.  Objective:   Vitals:   10/27/20 0912  BP: 113/70  Pulse: 90  Weight: 179 lb (81.2 kg)    Fetal Status: Fetal Heart Rate (bpm): 138/128   Movement: Present     General:  Alert, oriented and cooperative. Patient is in no acute distress.  Skin: Skin is warm and dry. No rash noted.   Cardiovascular: Normal heart rate noted  Respiratory: Normal respiratory effort, no problems with respiration noted  Abdomen: Soft, gravid, appropriate for gestational age. Pain/Pressure: Present     Pelvic: Vag. Bleeding: None Vag D/C Character: Mucous   Cervical exam deferred        Extremities: Normal range of motion.  Edema: None  Mental Status: Normal mood and affect. Normal behavior. Normal judgment and thought content.   Urinalysis:      Assessment and Plan:  Pregnancy: [id number] at [redacted]w[redacted]d  1. Supervision of high risk pregnancy, antepartum BP and FHRs normal On bedside US fetal positions are transverse and breech Discussed MFM recommendation for delivery around 37 weeks, she is in agreement Scheduled for pLTCS tomorrow w [facility], orders placed  2. Dichorionic diamniotic twin  pregnancy in third trimester Has been going to MFM regularly, though not our office IUGR twin B See above  3. ASCUS of cervix with negative high risk HPV Needs PP colpo  4. History of preterm delivery   Term labor symptoms and general obstetric precautions including but not limited to vaginal bleeding, contractions, leaking of fluid and fetal movement were reviewed in detail with the patient. Please refer to After Visit Summary for other counseling recommendations.  Return in 6 weeks (on 12/08/2020) for PP check.   Anna Maples, MD

## 2020-10-27 NOTE — Anesthesia Preprocedure Evaluation (Addendum)
Anesthesia Evaluation  Patient identified by MRN, date of birth, ID band Patient awake    Reviewed: Allergy & Precautions, NPO status , Patient's Chart, lab work & pertinent test results  History of Anesthesia Complications Negative for: history of anesthetic complications  Airway Mallampati: II  TM Distance: >3 FB Neck ROM: Full    Dental   Pulmonary neg pulmonary ROS,    Pulmonary exam normal        Cardiovascular negative cardio ROS Normal cardiovascular exam     Neuro/Psych negative neurological ROS     GI/Hepatic Neg liver ROS, GERD  ,  Endo/Other  negative endocrine ROS  Renal/GU negative Renal ROS  negative genitourinary   Musculoskeletal negative musculoskeletal ROS (+)   Abdominal   Peds  Hematology negative hematology ROS (+)   Anesthesia Other Findings   Reproductive/Obstetrics (+) Pregnancy (twins)                            Anesthesia Physical Anesthesia Plan  ASA: II  Anesthesia Plan: Spinal   Post-op Pain Management:    Induction:   PONV Risk Score and Plan: 3 and Ondansetron and Treatment may vary due to age or medical condition  Airway Management Planned: Natural Airway  Additional Equipment: None  Intra-op Plan:   Post-operative Plan:   Informed Consent: I have reviewed the patients History and Physical, chart, labs and discussed the procedure including the risks, benefits and alternatives for the proposed anesthesia with the patient or authorized representative who has indicated his/her understanding and acceptance.       Plan Discussed with:   Anesthesia Plan Comments:        Anesthesia Quick Evaluation

## 2020-10-28 ENCOUNTER — Encounter (HOSPITAL_COMMUNITY)
Admission: RE | Disposition: A | Discharge status: Home / Self Care | Payer: Self-pay | Source: Home / Self Care | Attending: Obstetrics and Gynecology

## 2020-10-28 ENCOUNTER — Inpatient Hospital Stay (HOSPITAL_COMMUNITY)
Admission: RE | Admit: 2020-10-28 | Discharge: 2020-10-31 | DRG: 788 | Disposition: A | Discharge status: Home / Self Care | Payer: Medicaid Other | Attending: Obstetrics and Gynecology | Admitting: Obstetrics and Gynecology

## 2020-10-28 ENCOUNTER — Encounter (HOSPITAL_COMMUNITY): Payer: Self-pay | Admitting: Family Medicine

## 2020-10-28 ENCOUNTER — Inpatient Hospital Stay (HOSPITAL_COMMUNITY): Payer: Medicaid Other | Admitting: Anesthesiology

## 2020-10-28 DIAGNOSIS — Z20822 Contact with and (suspected) exposure to covid-19: Secondary | ICD-10-CM | POA: Diagnosis present

## 2020-10-28 DIAGNOSIS — O322XX2 Maternal care for transverse and oblique lie, fetus 2: Secondary | ICD-10-CM | POA: Diagnosis present

## 2020-10-28 DIAGNOSIS — O365932 Maternal care for other known or suspected poor fetal growth, third trimester, fetus 2: Secondary | ICD-10-CM | POA: Diagnosis not present

## 2020-10-28 DIAGNOSIS — O322XX1 Maternal care for transverse and oblique lie, fetus 1: Secondary | ICD-10-CM | POA: Diagnosis not present

## 2020-10-28 DIAGNOSIS — O30049 Twin pregnancy, dichorionic/diamniotic, unspecified trimester: Secondary | ICD-10-CM | POA: Diagnosis present

## 2020-10-28 DIAGNOSIS — O30043 Twin pregnancy, dichorionic/diamniotic, third trimester: Secondary | ICD-10-CM | POA: Diagnosis not present

## 2020-10-28 DIAGNOSIS — Z3A37 37 weeks gestation of pregnancy: Secondary | ICD-10-CM | POA: Diagnosis not present

## 2020-10-28 DIAGNOSIS — Z8751 Personal history of pre-term labor: Secondary | ICD-10-CM

## 2020-10-28 DIAGNOSIS — Z98891 History of uterine scar from previous surgery: Secondary | ICD-10-CM

## 2020-10-28 DIAGNOSIS — R87610 Atypical squamous cells of undetermined significance on cytologic smear of cervix (ASC-US): Secondary | ICD-10-CM | POA: Diagnosis present

## 2020-10-28 LAB — CBC
HCT: 35.5 % — ABNORMAL LOW (ref 36.0–46.0)
Hemoglobin: 11.4 g/dL — ABNORMAL LOW (ref 12.0–15.0)
MCH: 31.1 pg (ref 26.0–34.0)
MCHC: 32.1 g/dL (ref 30.0–36.0)
MCV: 97 fL (ref 80.0–100.0)
Platelets: 142 10*3/uL — ABNORMAL LOW (ref 150–400)
RBC: 3.66 MIL/uL — ABNORMAL LOW (ref 3.87–5.11)
RDW: 12.7 % (ref 11.5–15.5)
WBC: 6.8 10*3/uL (ref 4.0–10.5)
nRBC: 0 % (ref 0.0–0.2)

## 2020-10-28 LAB — COMPREHENSIVE METABOLIC PANEL
ALT: 23 U/L (ref 0–44)
AST: 23 U/L (ref 15–41)
Albumin: 2.5 g/dL — ABNORMAL LOW (ref 3.5–5.0)
Alkaline Phosphatase: 126 U/L (ref 38–126)
Anion gap: 5 (ref 5–15)
BUN: <5 mg/dL — ABNORMAL LOW (ref 6–20)
CO2: 23 mmol/L (ref 22–32)
Calcium: 8.8 mg/dL — ABNORMAL LOW (ref 8.9–10.3)
Chloride: 107 mmol/L (ref 98–111)
Creatinine, Ser: 0.67 mg/dL (ref 0.44–1.00)
GFR, Estimated: >60 mL/min (ref >60)
Glucose, Bld: 77 mg/dL (ref 70–99)
Potassium: 4 mmol/L (ref 3.5–5.1)
Sodium: 135 mmol/L (ref 135–145)
Total Bilirubin: 0.8 mg/dL (ref 0.3–1.2)
Total Protein: 5.8 g/dL — ABNORMAL LOW (ref 6.5–8.1)

## 2020-10-28 LAB — GC/CHLAMYDIA PROBE AMP (~~LOC~~) NOT AT ARMC
Chlamydia: NEGATIVE
Comment: NEGATIVE
Comment: NORMAL
Neisseria Gonorrhea: NEGATIVE

## 2020-10-28 LAB — TYPE AND SCREEN
ABO/RH(D): O POS
Antibody Screen: NEGATIVE

## 2020-10-28 LAB — RESP PANEL BY RT-PCR (FLU A&B, COVID) ARPGX2
Influenza A by PCR: NEGATIVE
Influenza B by PCR: NEGATIVE
SARS Coronavirus 2 by RT PCR: NEGATIVE

## 2020-10-28 LAB — RPR: RPR Ser Ql: NONREACTIVE

## 2020-10-28 SURGERY — Surgical Case
Anesthesia: Spinal

## 2020-10-28 MED ORDER — TRANEXAMIC ACID-NACL 1000-0.7 MG/100ML-% IV SOLN: INTRAVENOUS | Status: DC | PRN

## 2020-10-28 MED ORDER — ONDANSETRON HCL 4 MG/2ML IJ SOLN
INTRAMUSCULAR | Status: AC
  Filled 2020-10-28: qty 2

## 2020-10-28 MED ORDER — IBUPROFEN 600 MG PO TABS
600.0000 mg | ORAL_TABLET | Freq: Four times a day (QID) | ORAL | Status: DC | PRN
  Administered 2020-10-29 – 2020-10-31 (×9): 600 mg via ORAL
  Filled 2020-10-28 (×9): qty 1

## 2020-10-28 MED ORDER — MEPERIDINE HCL 25 MG/ML IJ SOLN
INTRAMUSCULAR | Status: DC | PRN
  Administered 2020-10-28: 12.5 mg via INTRAVENOUS

## 2020-10-28 MED ORDER — ACETAMINOPHEN 10 MG/ML IV SOLN
INTRAVENOUS | Status: DC | PRN
  Administered 2020-10-28: 1000 mg via INTRAVENOUS

## 2020-10-28 MED ORDER — OXYTOCIN-SODIUM CHLORIDE 30-0.9 UT/500ML-% IV SOLN
INTRAVENOUS | Status: DC | PRN
  Administered 2020-10-28 (×2): 150 mL via INTRAVENOUS

## 2020-10-28 MED ORDER — DIPHENHYDRAMINE HCL 25 MG PO CAPS
25.0000 mg | ORAL_CAPSULE | Freq: Four times a day (QID) | ORAL | Status: DC | PRN
  Administered 2020-10-28 – 2020-10-29 (×2): 25 mg via ORAL
  Filled 2020-10-28 (×2): qty 1

## 2020-10-28 MED ORDER — ONDANSETRON HCL 4 MG/2ML IJ SOLN
4.0000 mg | Freq: Four times a day (QID) | INTRAMUSCULAR | Status: DC | PRN
  Administered 2020-10-28: 4 mg via INTRAVENOUS
  Filled 2020-10-28: qty 2

## 2020-10-28 MED ORDER — LACTATED RINGERS IV SOLN: INTRAVENOUS | Status: DC

## 2020-10-28 MED ORDER — OXYCODONE HCL 5 MG PO TABS: 5.0000 mg | ORAL_TABLET | Freq: Once | ORAL | Status: DC | PRN

## 2020-10-28 MED ORDER — KETOROLAC TROMETHAMINE 30 MG/ML IJ SOLN: 30.0000 mg | Freq: Four times a day (QID) | INTRAMUSCULAR | Status: AC | PRN

## 2020-10-28 MED ORDER — MORPHINE SULFATE (PF) 0.5 MG/ML IJ SOLN
INTRAMUSCULAR | Status: AC
  Filled 2020-10-28: qty 10

## 2020-10-28 MED ORDER — TETANUS-DIPHTH-ACELL PERTUSSIS 5-2.5-18.5 LF-MCG/0.5 IM SUSY: 0.5000 mL | PREFILLED_SYRINGE | Freq: Once | INTRAMUSCULAR | Status: DC

## 2020-10-28 MED ORDER — ZOLPIDEM TARTRATE 5 MG PO TABS: 5.0000 mg | ORAL_TABLET | Freq: Every evening | ORAL | Status: DC | PRN

## 2020-10-28 MED ORDER — OXYCODONE HCL 5 MG/5ML PO SOLN: 5.0000 mg | Freq: Once | ORAL | Status: DC | PRN

## 2020-10-28 MED ORDER — CEFAZOLIN SODIUM-DEXTROSE 2-4 GM/100ML-% IV SOLN: 2.0000 g | INTRAVENOUS | Status: DC

## 2020-10-28 MED ORDER — OXYCODONE HCL 5 MG PO TABS
5.0000 mg | ORAL_TABLET | ORAL | Status: DC | PRN
  Administered 2020-10-29 – 2020-10-31 (×11): 10 mg via ORAL
  Filled 2020-10-28 (×11): qty 2

## 2020-10-28 MED ORDER — MEPERIDINE HCL 25 MG/ML IJ SOLN: 6.2500 mg | INTRAMUSCULAR | Status: DC | PRN

## 2020-10-28 MED ORDER — ONDANSETRON HCL 4 MG/2ML IJ SOLN
4.0000 mg | Freq: Four times a day (QID) | INTRAMUSCULAR | Status: DC
  Administered 2020-10-28: 4 mg via INTRAVENOUS

## 2020-10-28 MED ORDER — ONDANSETRON HCL 4 MG/2ML IJ SOLN
INTRAMUSCULAR | Status: DC | PRN
  Administered 2020-10-28: 4 mg via INTRAVENOUS

## 2020-10-28 MED ORDER — SCOPOLAMINE 1 MG/3DAYS TD PT72
MEDICATED_PATCH | TRANSDERMAL | Status: DC | PRN
  Administered 2020-10-28: 1 via TRANSDERMAL

## 2020-10-28 MED ORDER — SIMETHICONE 80 MG PO CHEW
80.0000 mg | CHEWABLE_TABLET | Freq: Three times a day (TID) | ORAL | Status: DC
  Administered 2020-10-29 – 2020-10-31 (×8): 80 mg via ORAL
  Filled 2020-10-28 (×8): qty 1

## 2020-10-28 MED ORDER — PRENATAL MULTIVITAMIN CH
1.0000 | ORAL_TABLET | Freq: Every day | ORAL | Status: DC
  Administered 2020-10-29 – 2020-10-31 (×3): 1 via ORAL
  Filled 2020-10-28 (×3): qty 1

## 2020-10-28 MED ORDER — OXYTOCIN-SODIUM CHLORIDE 30-0.9 UT/500ML-% IV SOLN
INTRAVENOUS | Status: AC
  Filled 2020-10-28: qty 500

## 2020-10-28 MED ORDER — FENTANYL CITRATE (PF) 100 MCG/2ML IJ SOLN
INTRAMUSCULAR | Status: AC
  Filled 2020-10-28: qty 2

## 2020-10-28 MED ORDER — DEXAMETHASONE SODIUM PHOSPHATE 4 MG/ML IJ SOLN
INTRAMUSCULAR | Status: DC | PRN
  Administered 2020-10-28: 10 mg via INTRAVENOUS

## 2020-10-28 MED ORDER — ACETAMINOPHEN 500 MG PO TABS
1000.0000 mg | ORAL_TABLET | Freq: Four times a day (QID) | ORAL | Status: DC
  Administered 2020-10-28 – 2020-10-31 (×11): 1000 mg via ORAL
  Filled 2020-10-28 (×12): qty 2

## 2020-10-28 MED ORDER — PHENYLEPHRINE HCL-NACL 20-0.9 MG/250ML-% IV SOLN
INTRAVENOUS | Status: DC | PRN
  Administered 2020-10-28: 60 ug/min via INTRAVENOUS

## 2020-10-28 MED ORDER — CEFAZOLIN SODIUM-DEXTROSE 2-3 GM-%(50ML) IV SOLR
INTRAVENOUS | Status: DC | PRN
  Administered 2020-10-28: 2 g via INTRAVENOUS

## 2020-10-28 MED ORDER — PHENYLEPHRINE HCL-NACL 20-0.9 MG/250ML-% IV SOLN
INTRAVENOUS | Status: AC
  Filled 2020-10-28: qty 250

## 2020-10-28 MED ORDER — WITCH HAZEL-GLYCERIN EX PADS: 1.0000 "application " | MEDICATED_PAD | CUTANEOUS | Status: DC | PRN

## 2020-10-28 MED ORDER — MEPERIDINE HCL 25 MG/ML IJ SOLN
INTRAMUSCULAR | Status: AC
  Filled 2020-10-28: qty 1

## 2020-10-28 MED ORDER — DIBUCAINE (PERIANAL) 1 % EX OINT: 1.0000 "application " | TOPICAL_OINTMENT | CUTANEOUS | Status: DC | PRN

## 2020-10-28 MED ORDER — OXYTOCIN-SODIUM CHLORIDE 30-0.9 UT/500ML-% IV SOLN
2.5000 [IU]/h | INTRAVENOUS | Status: AC
  Administered 2020-10-28: 2.5 [IU]/h via INTRAVENOUS
  Filled 2020-10-28: qty 500

## 2020-10-28 MED ORDER — SOD CITRATE-CITRIC ACID 500-334 MG/5ML PO SOLN
30.0000 mL | ORAL | Status: AC
  Administered 2020-10-28: 30 mL via ORAL

## 2020-10-28 MED ORDER — SIMETHICONE 80 MG PO CHEW: 80.0000 mg | CHEWABLE_TABLET | ORAL | Status: DC | PRN

## 2020-10-28 MED ORDER — MENTHOL 3 MG MT LOZG: 1.0000 | LOZENGE | OROMUCOSAL | Status: DC | PRN

## 2020-10-28 MED ORDER — TRANEXAMIC ACID-NACL 1000-0.7 MG/100ML-% IV SOLN
INTRAVENOUS | Status: DC | PRN
  Administered 2020-10-28: 1000 mg via INTRAVENOUS

## 2020-10-28 MED ORDER — HYDROMORPHONE HCL 1 MG/ML IJ SOLN: 0.2500 mg | INTRAMUSCULAR | Status: DC | PRN

## 2020-10-28 MED ORDER — DEXAMETHASONE SODIUM PHOSPHATE 4 MG/ML IJ SOLN
INTRAMUSCULAR | Status: AC
  Filled 2020-10-28: qty 1

## 2020-10-28 MED ORDER — POVIDONE-IODINE 10 % EX SWAB
2.0000 "application " | Freq: Once | CUTANEOUS | Status: AC
  Administered 2020-10-28: 2 via TOPICAL

## 2020-10-28 MED ORDER — SODIUM CHLORIDE 0.9 % IR SOLN
Status: DC | PRN
  Administered 2020-10-28: 1

## 2020-10-28 MED ORDER — MORPHINE SULFATE (PF) 0.5 MG/ML IJ SOLN
INTRAMUSCULAR | Status: DC | PRN
  Administered 2020-10-28: .15 mg via INTRATHECAL

## 2020-10-28 MED ORDER — ACETAMINOPHEN 10 MG/ML IV SOLN: 1000.0000 mg | Freq: Once | INTRAVENOUS | Status: DC | PRN

## 2020-10-28 MED ORDER — COCONUT OIL OIL: 1.0000 "application " | TOPICAL_OIL | Status: DC | PRN

## 2020-10-28 MED ORDER — KETOROLAC TROMETHAMINE 30 MG/ML IJ SOLN
30.0000 mg | Freq: Four times a day (QID) | INTRAMUSCULAR | Status: AC | PRN
  Administered 2020-10-28 – 2020-10-29 (×3): 30 mg via INTRAVENOUS
  Filled 2020-10-28 (×2): qty 1

## 2020-10-28 MED ORDER — ENOXAPARIN SODIUM 40 MG/0.4ML IJ SOSY
40.0000 mg | PREFILLED_SYRINGE | INTRAMUSCULAR | Status: DC
  Administered 2020-10-29 – 2020-10-31 (×3): 40 mg via SUBCUTANEOUS
  Filled 2020-10-28 (×3): qty 0.4

## 2020-10-28 MED ORDER — SOD CITRATE-CITRIC ACID 500-334 MG/5ML PO SOLN
ORAL | Status: AC
  Filled 2020-10-28: qty 30

## 2020-10-28 MED ORDER — KETOROLAC TROMETHAMINE 30 MG/ML IJ SOLN
INTRAMUSCULAR | Status: AC
  Filled 2020-10-28: qty 1

## 2020-10-28 MED ORDER — CEFAZOLIN SODIUM-DEXTROSE 2-4 GM/100ML-% IV SOLN
INTRAVENOUS | Status: AC
  Filled 2020-10-28: qty 100

## 2020-10-28 MED ORDER — FENTANYL CITRATE (PF) 100 MCG/2ML IJ SOLN
INTRAMUSCULAR | Status: DC | PRN
  Administered 2020-10-28: 15 ug via INTRATHECAL

## 2020-10-28 MED ORDER — PROMETHAZINE HCL 25 MG/ML IJ SOLN: 6.2500 mg | INTRAMUSCULAR | Status: DC | PRN

## 2020-10-28 MED ORDER — SENNOSIDES-DOCUSATE SODIUM 8.6-50 MG PO TABS
2.0000 | ORAL_TABLET | ORAL | Status: DC
  Administered 2020-10-29 – 2020-10-31 (×3): 2 via ORAL
  Filled 2020-10-28 (×3): qty 2

## 2020-10-28 MED ORDER — BUPIVACAINE IN DEXTROSE 0.75-8.25 % IT SOLN
INTRATHECAL | Status: DC | PRN
  Administered 2020-10-28: 1.8 mL via INTRATHECAL

## 2020-10-28 SURGICAL SUPPLY — 31 items
BENZOIN TINCTURE PRP APPL 2/3 (GAUZE/BANDAGES/DRESSINGS) ×2 IMPLANT
CHLORAPREP W/TINT 26ML (MISCELLANEOUS) ×2 IMPLANT
CLAMP CORD UMBIL (MISCELLANEOUS) IMPLANT
CLOTH BEACON ORANGE TIMEOUT ST (SAFETY) ×2 IMPLANT
DRSG OPSITE POSTOP 4X10 (GAUZE/BANDAGES/DRESSINGS) ×2 IMPLANT
ELECT REM PT RETURN 9FT ADLT (ELECTROSURGICAL) ×2
ELECTRODE REM PT RTRN 9FT ADLT (ELECTROSURGICAL) ×1 IMPLANT
EXTRACTOR VACUUM M CUP 4 TUBE (SUCTIONS) IMPLANT
GLOVE BIOGEL PI IND STRL 7.0 (GLOVE) ×2 IMPLANT
GLOVE BIOGEL PI IND STRL 7.5 (GLOVE) ×2 IMPLANT
GLOVE BIOGEL PI INDICATOR 7.0 (GLOVE) ×2
GLOVE BIOGEL PI INDICATOR 7.5 (GLOVE) ×2
GLOVE ECLIPSE 7.5 STRL STRAW (GLOVE) ×2 IMPLANT
GOWN STRL REUS W/TWL LRG LVL3 (GOWN DISPOSABLE) ×6 IMPLANT
KIT ABG SYR 3ML LUER SLIP (SYRINGE) IMPLANT
NEEDLE HYPO 25X5/8 SAFETYGLIDE (NEEDLE) IMPLANT
NS IRRIG 1000ML POUR BTL (IV SOLUTION) ×2 IMPLANT
PACK C SECTION WH (CUSTOM PROCEDURE TRAY) ×2 IMPLANT
PAD OB MATERNITY 4.3X12.25 (PERSONAL CARE ITEMS) ×2 IMPLANT
PENCIL SMOKE EVAC W/HOLSTER (ELECTROSURGICAL) ×2 IMPLANT
RTRCTR C-SECT PINK 25CM LRG (MISCELLANEOUS) ×2 IMPLANT
STRIP CLOSURE SKIN 1/2X4 (GAUZE/BANDAGES/DRESSINGS) ×4 IMPLANT
SUT VIC AB 0 CT1 36 (SUTURE) ×2 IMPLANT
SUT VIC AB 0 CTX 36 (SUTURE) ×2
SUT VIC AB 0 CTX36XBRD ANBCTRL (SUTURE) ×2 IMPLANT
SUT VIC AB 2-0 CT1 27 (SUTURE) ×1
SUT VIC AB 2-0 CT1 TAPERPNT 27 (SUTURE) ×1 IMPLANT
SUT VIC AB 4-0 KS 27 (SUTURE) ×2 IMPLANT
TOWEL OR 17X24 6PK STRL BLUE (TOWEL DISPOSABLE) ×2 IMPLANT
TRAY FOLEY W/BAG SLVR 14FR LF (SET/KITS/TRAYS/PACK) ×2 IMPLANT
WATER STERILE IRR 1000ML POUR (IV SOLUTION) ×2 IMPLANT

## 2020-10-28 NOTE — H&P (Signed)
OBSTETRIC ADMISSION HISTORY AND PHYSICAL  Francyne Arreaga is a 30 y.o. female [phone] with IUP at [redacted]w[redacted]d by LMP presenting for scheduled primary Cesarean delivery given malpresentation of Twin A in the setting of IUGR of Twin B. She reports +FMs, No LOF, no VB, no blurry vision, headaches or peripheral edema, and RUQ pain.  She plans on breast feeding. She plans on partner vasectomy for birth control.  She received her prenatal care at [facility]   Dating: By LMP --->  Estimated Date of Delivery: 11/16/20  Sono:  @[redacted]w[redacted]d , CWD Twin A: normal anatomy, cephalic presentation,  2762g, 22% EFW Twin B: normal anatomy, transverse presentation, 2541g, 9% EFW  Prenatal History/Complications:  - Twin B (possible renal anomaly) - IUGR of Twin B (8% growth discordance)  Past Medical History: Past Medical History:  Diagnosis Date  . Chronic headaches   . Insomnia   . Medical history non-contributory     Past Surgical History: Past Surgical History:  Procedure Laterality Date  . NO PAST SURGERIES      Obstetrical History: OB History    Gravida  5   Para  3   Term  2   Preterm  1   AB  1   Living  3     SAB  0   IAB  1   Ectopic  0   Multiple  0   Live Births  3           Social History Social History   Socioeconomic History  . Marital status: Married    Spouse name: Not on file  . Number of children: Not on file  . Years of education: Not on file  . Highest education level: Not on file  Occupational History  . Not on file  Tobacco Use  . Smoking status: Never Smoker  . Smokeless tobacco: Never Used  Vaping Use  . Vaping Use: Some days  . Start date: 02/06/2018  . Substances: Nicotine  . Devices: Stig   Substance and Sexual Activity  . Alcohol use: No  . Drug use: No  . Sexual activity: Yes  Other Topics Concern  . Not on file  Social History Narrative  . Not on file   Social Determinants of Health   Financial Resource Strain: Not on file  Food Insecurity:  No Food Insecurity  . Worried About 04/08/2018 in the Last Year: Never true  . Ran Out of Food in the Last Year: Never true  Transportation Needs: No Transportation Needs  . Lack of Transportation (Medical): No  . Lack of Transportation (Non-Medical): No  Physical Activity: Not on file  Stress: Not on file  Social Connections: Not on file    Family History: Family History  Problem Relation Age of Onset  . Depression Mother   . Depression Father     Allergies: Allergies  Allergen Reactions  . Ciprofloxacin Hcl Anaphylaxis    Medications Prior to Admission  Medication Sig Dispense Refill Last Dose  . omeprazole (PRILOSEC) 20 MG capsule Take 20 mg by mouth See admin instructions. Take 1 capsule (20 mg) by mouth scheduled in the morning and may take an additional dose (20 mg) by mouth in the evening if needed for indigestion/hearburn   Past Week at Unknown time  . Prenatal Vit-Fe Fumarate-FA (MULTIVITAMIN-PRENATAL) 27-0.8 MG TABS tablet Take 1 tablet by mouth in the morning.   Past Week at Unknown time  . aspirin EC 81 MG tablet Take 1  tablet (81 mg total) by mouth daily. Take after 12 weeks for prevention of preeclampsia later in pregnancy (Patient not taking: Reported on 10/27/2020) 300 tablet 2 Not Taking at Unknown time     Review of Systems   All systems reviewed and negative except as stated in HPI  Blood pressure 117/82, pulse 84, temperature 98.3 F (36.8 C), temperature source Oral, resp. rate 20, height 5\' 9"  (1.753 m), weight 81.2 kg, last menstrual period 02/10/2020, SpO2 100 %. General appearance: alert, cooperative and appears stated age Lungs: clear to auscultation bilaterally Heart: regular rate and rhythm Abdomen: soft, non-tender; bowel sounds normal Extremities: Homans sign is negative, no sign of DVT Presentation: transverse/transverse on bedside u/s by Dr. 02/12/2020 this AM Fetal monitoring: 154 A 164 b Uterine activityNone Exam by::  Goswick   Prenatal labs: ABO, Rh: --/--/PENDING (06/01 02-11-1988) Antibody: PENDING (06/01 0755) Rubella: 1.23 (11/30 1034) RPR: Non Reactive (05/05 0935)  HBsAg: Negative (11/30 1034)  HIV: Non Reactive (05/05 0935)  GBS:   pending 2 hr Glucola wnl Genetic screening  afp neg Anatomy 06-18-2001 twin gestation  Prenatal Transfer Tool  Maternal Diabetes: No Genetic Screening: normal afp Maternal Ultrasounds/Referrals: IUGR twin b Fetal Ultrasounds or other Referrals:  None Maternal Substance Abuse:  No Significant Maternal Medications:  None Significant Maternal Lab Results: Other: gbs unknown  Results for orders placed or performed during the hospital encounter of 10/28/20 (from the past 24 hour(s))  CBC   Collection Time: 10/28/20  7:30 AM  Result Value Ref Range   WBC 6.8 4.0 - 10.5 K/uL   RBC 3.66 (L) 3.87 - 5.11 MIL/uL   Hemoglobin 11.4 (L) 12.0 - 15.0 g/dL   HCT 12/28/20 (L) 02.7 - 74.1 %   MCV 97.0 80.0 - 100.0 fL   MCH 31.1 26.0 - 34.0 pg   MCHC 32.1 30.0 - 36.0 g/dL   RDW 28.7 86.7 - 67.2 %   Platelets 142 (L) 150 - 400 K/uL   nRBC 0.0 0.0 - 0.2 %  Type and screen   Collection Time: 10/28/20  7:55 AM  Result Value Ref Range   ABO/RH(D) PENDING    Antibody Screen PENDING    Sample Expiration      10/31/2020,2359 Performed at [facility], [address] [place], [place]     Patient Active Problem List   Diagnosis Date Noted  . [redacted] weeks gestation of pregnancy 10/15/2020  . Heartburn in pregnancy in second trimester 05/27/2020  . Dichorionic diamniotic twin gestation 05/13/2020  . Supervision of high risk pregnancy, antepartum 04/21/2020  . History of preterm delivery 04/21/2020  . ASCUS of cervix with negative high risk HPV 12/10/2018    Assessment/Plan:  Sadae Arrazola is a 30 y.o. 38 at 108w2d here for pltcs for di/di twins, iugr twin b, malpresentation (transverse/transverse).  The risks of cesarean section were discussed with the patient  including but were not limited to: bleeding which may require transfusion or reoperation; infection which may require antibiotics; injury to bowel, bladder, ureters or other surrounding organs; injury to the fetus; need for additional procedures including hysterectomy in the event of a life-threatening hemorrhage; placental abnormalities wth subsequent pregnancies, incisional problems, thromboembolic phenomenon and other postoperative/anesthesia complications.  Patient has been NPO since midnight she will remain NPO for procedure. Anesthesia and OR aware.  Preoperative prophylactic antibiotics and SCDs ordered on call to the OR.  To OR when ready.  #ID:  gbs pending, no indication for ppx, 2  g ancef ordered #MOF: br/bot #MOC: vasectomy (offered btl, pt declined) #Circ:  yes  Silvano Bilis, MD  10/28/2020, 8:53 AM

## 2020-10-28 NOTE — Anesthesia Postprocedure Evaluation (Signed)
Anesthesia Post Note  Patient: Anna [name]  Procedure(s) Performed: CESAREAN SECTION MULTI-GESTATIONAL (N/A )     Patient location during evaluation: PACU Anesthesia Type: Spinal Level of consciousness: oriented and awake and alert Pain management: pain level controlled Vital Signs Assessment: post-procedure vital signs reviewed and stable Respiratory status: spontaneous breathing, respiratory function stable and nonlabored ventilation Cardiovascular status: blood pressure returned to baseline and stable Postop Assessment: no headache, no backache, no apparent nausea or vomiting and spinal receding Anesthetic complications: no   No complications documented.  Last Vitals:  Vitals:   10/28/20 1215 10/28/20 1231  BP: 116/80 111/75  Pulse: 60 61  Resp: 14 18  Temp: 36.7 C (!) 36.4 C  SpO2: 100% 99%    Last Pain:  Vitals:   10/28/20 1231  TempSrc: Oral  PainSc:    Pain Goal: Patients Stated Pain Goal: 4 (10/28/20 0752)  LLE Motor Response: Purposeful movement (10/28/20 1215) LLE Sensation: Tingling (10/28/20 1215) RLE Motor Response: Purposeful movement (10/28/20 1215) RLE Sensation: Tingling (10/28/20 1215)     Epidural/Spinal Function Cutaneous sensation: Tingles (10/28/20 1215), Patient able to flex knees: Yes (10/28/20 1215), Patient able to lift hips off bed: No (10/28/20 1215), Back pain beyond tenderness at insertion site: No (10/28/20 1215), Progressively worsening motor and/or sensory loss: No (10/28/20 1215), Bowel and/or bladder incontinence post epidural: No (10/28/20 1215)  [name]

## 2020-10-28 NOTE — Op Note (Signed)
[facility] PROCEDURE DATE: 10/28/2020  PREOPERATIVE DIAGNOSES: Intrauterine pregnancy at [redacted]w[redacted]d weeks gestation; malpresentation: transverse presentation of Twin A, IUGR of Twin B  POSTOPERATIVE DIAGNOSES: The same  PROCEDURE: Primary Low Transverse Cesarean Section  SURGEON:  Dr. Shonna Chock, MD  ASSISTANT:  Dr. Lynnda Shields, MD  ANESTHESIOLOGY TEAM: Anesthesiologist: [name], MD CRNA: Cleda Clarks, CRNA; Dairl Ponder, CRNA  INDICATIONS: Anna [name] is a 30 y.o. [phone] at [redacted]w[redacted]d here for cesarean section secondary to the indications listed under preoperative diagnoses; please see preoperative note for further details.  The risks of surgery were discussed with the patient including but were not limited to: bleeding which may require transfusion or reoperation; infection which may require antibiotics; injury to bowel, bladder, ureters or other surrounding organs; injury to the fetus; need for additional procedures including hysterectomy in the event of a life-threatening hemorrhage; formation of adhesions; placental abnormalities wth subsequent pregnancies; incisional problems; thromboembolic phenomenon and other postoperative/anesthesia complications. The patient concurred with the proposed plan, giving informed written consent for the procedure.  FINDINGS:   Twin A: Viable female infant in transverse presentation.  Apgars 8 and 8.  Clear amniotic fluid.  Intact placenta, three vessel cord.  Twin B: Viable female infant in cephalic presentation.  Apgars 8 and 9.  Clear amniotic fluid.  Intact placenta, three vessel cord. Normal uterus, fallopian tubes and ovaries bilaterally.  ANESTHESIA: Spinal  INTRAVENOUS FLUIDS: 2,400 ml   ESTIMATED BLOOD LOSS: 644 ml URINE OUTPUT: 150 ml SPECIMENS: Placenta sent to pathology COMPLICATIONS: None immediate  PROCEDURE IN DETAIL:  The patient preoperatively received intravenous antibiotics and had sequential compression devices applied to her  lower extremities.  She was then taken to the operating room where spinal anesthesia was administered and was found to be adequate. She was then placed in a dorsal supine position with a leftward tilt, and prepped and draped in a sterile manner.  A foley catheter was placed into her bladder and attached to constant gravity.  After an adequate timeout was performed, a Pfannenstiel skin incision was made with scalpel and carried through to the underlying layer of fascia. The fascia was incised in the midline, and this incision was extended bluntly. The rectus muscles were separated in the midline and the peritoneum was entered bluntly. The Alexis self-retaining retractor was introduced into the abdominal cavity.  Attention was turned to the lower uterine segment where a low transverse hysterotomy was made with a scalpel and extended bilaterally bluntly.  Twin A was successfully delivered from transverse presentation with use of breech maneuvers; the cord was clamped and cut after one minute, and the infant was handed over to the awaiting neonatology team. Twin B was successfully delivered from cephalic presentation; the cord was clamped and cut after one minute, and the infant was handed over to the awaiting neonatology team.   Uterine massage and TXA were administered. Placenta delivered intact with a three-vessel cord x2. The uterus was then cleared of clots and debris.  The hysterotomy was closed with 0 Vicryl in a running locked fashion, and an imbricating layer was also placed with 0 Vicryl. The pelvis was cleared of all clot and debris. Hemostasis was confirmed on all surfaces.  The retractor was removed.  The peritoneum was closed with a 2 Vicryl running stitch. The fascia was then closed using 0 Vicryl in a running fashion.  The subcutaneous layer was irrigated, and the skin was closed with a 4-0 Vicryl subcuticular stitch. The patient tolerated the  procedure well. Sponge, instrument and needle counts were  correct x 3.  She was taken to the recovery room in stable condition.   Sheila Oats, MD OB Fellow, Faculty [facility] 10/28/2020 10:56 AM

## 2020-10-28 NOTE — Anesthesia Procedure Notes (Signed)
Spinal  Patient location during procedure: OR Reason for block: surgical anesthesia Staffing Performed: anesthesiologist  Anesthesiologist: Anaih Brander E, MD Preanesthetic Checklist Completed: patient identified, IV checked, risks and benefits discussed, surgical consent, monitors and equipment checked, pre-op evaluation and timeout performed Spinal Block Patient position: sitting Prep: DuraPrep and site prepped and draped Patient monitoring: continuous pulse ox, blood pressure and heart rate Approach: midline Location: L3-4 Injection technique: single-shot Needle Needle type: Pencan  Needle gauge: 24 G Needle length: 9 cm Assessment Events: CSF return Additional Notes Functioning IV was confirmed and monitors were applied. Sterile prep and drape, including hand hygiene and sterile gloves were used. The patient was positioned and the spine was prepped. The skin was anesthetized with lidocaine.  Free flow of clear CSF was obtained prior to injecting local anesthetic into the CSF. The needle was carefully withdrawn. The patient tolerated the procedure well.     

## 2020-10-28 NOTE — Discharge Summary (Signed)
Postpartum Discharge Summary     Patient Name: Anna [name] DOB: 22-May-1991 MRN: [id number]  Date of admission: 10/28/2020 Delivery date:   Tanise, Russman [[id number]]  10/28/2020    Lucie, Friedlander [[id number]]  10/28/2020   Delivering provider:    Casper Harrison [[id number]]  [place], [place]    [place]ac[place], [place] [[id number]]  [place], [place]   Date of discharge: 10/31/2020  Admitting diagnosis: Dichorionic diamniotic twin gestation [O30.049] Status post primary low transverse cesarean section [Z98.891] Intrauterine pregnancy: [redacted]w[redacted]d    Secondary diagnosis:  Principal Problem:   IUGR (intrauterine growth restriction) affecting care of mother, third trimester, fetus 2 Active Problems:   ASCUS of cervix with negative high risk HPV   History of preterm delivery   Dichorionic diamniotic twin gestation   Cesarean delivery delivered   Status post primary low transverse cesarean section  Additional problems: as noted above    Discharge diagnosis: Primary Cesarean delivery delivered                                          Post partum procedures:none Augmentation: N/A Complications: None  Hospital course: Sceduled C/S   30y.o. yo G325 196 1982at 371w2das admitted to the hospital 10/28/2020 for scheduled cesarean section with the following indication:Malpresentation of Twin A (Transverse). Delivery details are as follows:  Membrane Rupture Time/Date:    AlKenedy, Haisley0[[id number]]10:07 AM    AlAnnalee GentazSandy Valley0[[id number]]10:10 AM  ,   AlKateland, Leisinger0[[id number]]10/28/2020    AlAanchal, Cope0[[id number]]10/28/2020    Delivery Method:   AlCasper Harrison0[[id number]]C-Section, Low Transverse    AlAzul, Coffie0[[id number]]C-Section, Low Transverse   Details of operation can be found in separate operative note.  Patient had an uncomplicated postpartum course.  She is ambulating, tolerating a regular diet, passing flatus, and urinating well. Patient is  discharged home in stable condition on  10/31/20        Newborn Data: Birth date:   AlDaleisa, Halperin0[[id number]]10/28/2020    AlAudrina, Marten0[[id number]]10/28/2020   Birth time:   AlAriaunna, Long[place]0[[id number]]10:08 AM    AlJanet Berlin0[[id number]]10:11 AM   Gender:   AlShalana, Jardin0[[id number]]Female    AlTaja, Pentland0[[id number]]Female   Living status:   AlKamika, Goodloe0[[id number]]Living    [place]eeda, [place]0[[id number]]Living   Apgars:   AlTereasa, Yilmaz0[[id number]]8 [address][[id number]]  9,   AlKrisann, Mckennaz[place]0[[id number]]8    AlKeri, Vealez[place]0[[id number]]9   Weight:   AlAndreka, Stucki0[[id number]]2740 g    AlShonta, Bourque0[[id number]]2350 g      Magnesium Sulfate received: No BMZ received: No Rhophylac:N/A MMR:N/A T-DaP:Given prenatally Flu: offered prior to discharge Transfusion:No  Physical exam  Vitals:   10/30/20 0500 10/30/20 1920 10/30/20 2101 10/31/20 0615  BP: 106/75 106/72 109/74 113/87  Pulse: 61 63 67 60  Resp: '17 17 15 17  ' Temp: 97.6 F (36.4 C) 98.5 F (36.9 C) 98.1 F (36.7 C) 98.3 F (36.8 C)  TempSrc: Oral Oral Oral Oral  SpO2: 100% 100%  100% 100%  Weight:      Height:       General: alert, cooperative and no distress Lochia: appropriate Uterine Fundus: firm Incision: N/A DVT Evaluation: No evidence of DVT seen on physical exam. No cords or calf tenderness. Labs: Lab Results  Component Value Date   WBC 12.5 (H) 10/29/2020   HGB 9.3 (L) 10/29/2020   HCT 27.4 (L) 10/29/2020   MCV 93.8 10/29/2020   PLT 141 (L) 10/29/2020   CMP Latest Ref Rng & Units 10/28/2020  Glucose 70 - 99 mg/dL 77  BUN 6 - 20 mg/dL <5(L)  Creatinine 0.44 - 1.00 mg/dL 0.67  Sodium 135 - 145 mmol/L 135  Potassium 3.5 - 5.1 mmol/L 4.0  Chloride 98 - 111 mmol/L 107  CO2 22 - 32 mmol/L 23  Calcium 8.9 - 10.3 mg/dL 8.8(L)  Total Protein 6.5 - 8.1 g/dL 5.8(L)  Total Bilirubin 0.3 - 1.2 mg/dL  0.8  Alkaline Phos 38 - 126 U/L 126  AST 15 - 41 U/L 23  ALT 0 - 44 U/L 23   Edinburgh Score: Edinburgh Postnatal Depression Scale Screening Tool 10/30/2020  I have been able to laugh and see the funny side of things. 0  I have looked forward with enjoyment to things. 0  I have blamed myself unnecessarily when things went wrong. 0  I have been anxious or worried for no good reason. 2  I have felt scared or panicky for no good reason. 1  Things have been getting on top of me. 0  I have been so unhappy that I have had difficulty sleeping. 0  I have felt sad or miserable. 0  I have been so unhappy that I have been crying. 0  The thought of harming myself has occurred to me. 0  Edinburgh Postnatal Depression Scale Total 3     After visit meds:  Allergies as of 10/31/2020      Reactions   Ciprofloxacin Hcl Anaphylaxis      Medication List    TAKE these medications   acetaminophen 500 MG tablet Commonly known as: TYLENOL Take 2 tablets (1,000 mg total) by mouth every 6 (six) hours.   aspirin EC 81 MG tablet Take 1 tablet (81 mg total) by mouth daily. Take after 12 weeks for prevention of preeclampsia later in pregnancy   coconut oil Oil Apply 1 application topically as needed.   ferrous sulfate 325 (65 FE) MG tablet Take 1 tablet (325 mg total) by mouth every other day.   ibuprofen 600 MG tablet Commonly known as: ADVIL Take 1 tablet (600 mg total) by mouth every 6 (six) hours as needed for fever or headache.   multivitamin-prenatal 27-0.8 MG Tabs tablet Take 1 tablet by mouth in the morning.   omeprazole 20 MG capsule Commonly known as: PRILOSEC Take 20 mg by mouth See admin instructions. Take 1 capsule (20 mg) by mouth scheduled in the morning and may take an additional dose (20 mg) by mouth in the evening if needed for indigestion/hearburn   oxyCODONE 5 MG immediate release tablet Commonly known as: Oxy IR/ROXICODONE Take 1-2 tablets (5-10 mg total) by mouth every 4  (four) hours as needed for moderate pain.   polyethylene glycol 17 g packet Commonly known as: MiraLax Take 17 g by mouth daily.            Discharge Care Instructions  (From admission, onward)         Start  Ordered   10/31/20 0000  Discharge wound care:       Comments: Remove wound dressing 5-7 days postoperatively   10/31/20 0100           Discharge home in stable condition Infant Feeding: Breast Infant Disposition:home with mother Discharge instruction: per After Visit Summary and Postpartum booklet. Activity: Advance as tolerated. Pelvic rest for 6 weeks.  Diet: routine diet Future Appointments: Future Appointments  Date Time Provider [place]  12/08/2020  8:35 AM Ardean Larsen, Mervyn Skeeters, CNM [facility]   Follow up Visit: Message sent to [facility] by Dr. Astrid Drafts  Please schedule this patient for a In person postpartum visit in 6 weeks with the following provider: Any provider. Additional Postpartum F/U:Incision check 1 week  High risk pregnancy complicated by: DiDi Twins, IUGR of Twin B, Malpresentation of Twin A Delivery mode:     Larose, Batres [[id number]]  C-Section, Low Transverse    Nataleigh, Griffin [[id number]]  C-Section, Low Transverse   Anticipated Birth Control:  plan for partner vasectomy   10/31/2020 Janet [place], MD

## 2020-10-28 NOTE — Lactation Note (Signed)
This note was copied from a baby's chart. Lactation Consultation Note  Patient Name: Anna [name] UUVOZ'D Date: 10/28/2020 Reason for consult: Initial assessment;Multiple gestation;Early term 37-38.6wks Age:32 hours   Mom resting. Previously she felt poorly but is feeling better now but is tired.  Infants both sleeping.  Mom states she is able to see her colostrum.  She is an exp. Breastfeeding mom.  LC discussed importance of STS, hand expression, and providing infant extra calories to give more energy for breastfeeding.  Mom has a personal pump dad brought to the hospital.  LC suggested using our DEBP while here.  LC reviewed feeding with cues, 8-12X in 24 hours.  Mom was encouraged to collect any extra to feed back EBM to infants and to stimulate milk supply. Spoon provided for mom to hand express into.     LC spoke with RN about pump set up for mom.    Mom was provided brochure and is aware of OP services, phone line, and support groups.  Mom is a patient at the [facility] and was encouraged to see Soyla Dryer RN, [facility] for follow up after DC.    Mom will call out with assistance with BF.  Maternal Data Has patient been taught Hand Expression?: Yes Does the patient have breastfeeding experience prior to this delivery?: Yes How long did the patient breastfeed?: 6 months with 9 and 5 yr olds, 26 months with her 32 yr old  Feeding Mother's Current Feeding Choice: Breast Milk  LATCH Score                    Lactation Tools Discussed/Used Tools:  (dad brought mom's lanisnoh pump; LC encouraged hospital DEBP for use while in the hosptial)  Interventions Interventions: Breast feeding basics reviewed  Discharge Pump: Personal WIC Program: No (mom is thinking of applying)  Consult Status Consult Status: Follow-up Date: 10/28/20 Follow-up type: In-patient    Maryruth Hancock [facility] 10/28/2020, 8:41 PM

## 2020-10-28 NOTE — Transfer of Care (Signed)
Immediate Anesthesia Transfer of Care Note  Patient: Anna [name]  Procedure(s) Performed: CESAREAN SECTION MULTI-GESTATIONAL (N/A )  Patient Location: PACU  Anesthesia Type:Spinal  Level of Consciousness: awake, alert  and oriented  Airway & Oxygen Therapy: Patient Spontanous Breathing  Post-op Assessment: Report given to RN and Post -op Vital signs reviewed and stable  Post vital signs: Reviewed and stable  Last Vitals:  Vitals Value Taken Time  BP    Temp    Pulse    Resp    SpO2      Last Pain:  Vitals:   10/28/20 0752  TempSrc: Oral  PainSc: 0-No pain      Patients Stated Pain Goal: 4 (10/28/20 0752)  Complications: No complications documented.

## 2020-10-28 NOTE — Discharge Instructions (Signed)

## 2020-10-28 NOTE — Lactation Note (Signed)
This note was copied from a baby's chart. Lactation Consultation Note  Patient Name: Anna [name] JJHER'D Date: 10/28/2020   Age:32 hours  Attempted to visit with mom but she was asleep. She briefly woke up when [facility] was asking FOB if she could come back later and mom nodded and asked LC to come back at a later time. LC to come back later this afternoon for initial assessment.   Maternal Data    Feeding    LATCH Score                    Lactation Tools Discussed/Used    Interventions    Discharge    Consult Status      Anna [name] [name] 10/28/2020, 2:20 PM

## 2020-10-28 NOTE — Anesthesia Postprocedure Evaluation (Signed)
Anesthesia Post Note  Patient: Anna [name]  Procedure(s) Performed: CESAREAN SECTION MULTI-GESTATIONAL (N/A )     Anesthesia Post Evaluation No complications documented.  Last Vitals:  Vitals:   10/28/20 0752  BP: 117/82  Pulse: 84  Resp: 20  Temp: 36.8 C  SpO2: 100%    Last Pain:  Vitals:   10/28/20 0752  TempSrc: Oral  PainSc: 0-No pain   Pain Goal: Patients Stated Pain Goal: 4 (10/28/20 0752)                 Cleda Clarks

## 2020-10-29 LAB — CBC
HCT: 27.4 % — ABNORMAL LOW (ref 36.0–46.0)
Hemoglobin: 9.3 g/dL — ABNORMAL LOW (ref 12.0–15.0)
MCH: 31.8 pg (ref 26.0–34.0)
MCHC: 33.9 g/dL (ref 30.0–36.0)
MCV: 93.8 fL (ref 80.0–100.0)
Platelets: 141 10*3/uL — ABNORMAL LOW (ref 150–400)
RBC: 2.92 MIL/uL — ABNORMAL LOW (ref 3.87–5.11)
RDW: 12.4 % (ref 11.5–15.5)
WBC: 12.5 10*3/uL — ABNORMAL HIGH (ref 4.0–10.5)
nRBC: 0 % (ref 0.0–0.2)

## 2020-10-29 MED ORDER — NALBUPHINE HCL 10 MG/ML IJ SOLN: 5.0000 mg | Freq: Once | INTRAMUSCULAR | Status: DC | PRN

## 2020-10-29 MED ORDER — SODIUM CHLORIDE 0.9% FLUSH: 3.0000 mL | INTRAVENOUS | Status: DC | PRN

## 2020-10-29 MED ORDER — NALOXONE HCL 0.4 MG/ML IJ SOLN: 0.4000 mg | INTRAMUSCULAR | Status: DC | PRN

## 2020-10-29 MED ORDER — NALOXONE HCL 4 MG/10ML IJ SOLN
1.0000 ug/kg/h | INTRAVENOUS | Status: DC | PRN
  Filled 2020-10-29: qty 5

## 2020-10-29 MED ORDER — NALBUPHINE HCL 10 MG/ML IJ SOLN
5.0000 mg | INTRAMUSCULAR | Status: DC | PRN
Start: 2020-10-29 — End: 2020-11-01

## 2020-10-29 MED ORDER — SCOPOLAMINE 1 MG/3DAYS TD PT72: 1.0000 | MEDICATED_PATCH | Freq: Once | TRANSDERMAL | Status: DC

## 2020-10-29 MED ORDER — DIPHENHYDRAMINE HCL 25 MG PO CAPS: 25.0000 mg | ORAL_CAPSULE | ORAL | Status: DC | PRN

## 2020-10-29 MED ORDER — ONDANSETRON HCL 4 MG/2ML IJ SOLN: 4.0000 mg | Freq: Three times a day (TID) | INTRAMUSCULAR | Status: DC | PRN

## 2020-10-29 MED ORDER — FERROUS FUMARATE 324 (106 FE) MG PO TABS
1.0000 | ORAL_TABLET | ORAL | Status: DC
  Administered 2020-10-29 – 2020-10-31 (×2): 106 mg via ORAL
  Filled 2020-10-29 (×2): qty 1

## 2020-10-29 MED ORDER — DIPHENHYDRAMINE HCL 50 MG/ML IJ SOLN: 12.5000 mg | INTRAMUSCULAR | Status: DC | PRN

## 2020-10-29 MED ORDER — NALBUPHINE HCL 10 MG/ML IJ SOLN: 5.0000 mg | INTRAMUSCULAR | Status: DC | PRN

## 2020-10-29 NOTE — Lactation Note (Signed)
This note was copied from a baby's chart. Lactation Consultation Note Mom concerned about twins needing supplemented. Mom asked about Donor milk. Mom signed consent. Mom has used DEBP and didn't collect anything at this time. Explained normal. experienced BF mom understands how BF is and how long it takes. Encouraged mom to call for latch assistance if needed. Mom has everted nipples. Hand expression w/a dot of colostrum noted. Mom stated she hasn't been leaking yet.  Patient Name: Anna [name] DHRCB'U Date: 10/29/2020 Reason for consult: Follow-up assessment;Multiple gestation;Early term 37-38.6wks Age:82 hours  Maternal Data    Feeding Mother's Current Feeding Choice: Breast Milk and Donor Milk Nipple Type: Extra Slow Flow  LATCH Score                    Lactation Tools Discussed/Used Tools: Pump Breast pump type: Double-Electric Breast Pump Pump Education: Setup, frequency, and cleaning;Milk Storage (RN) Reason for Pumping: twins/supplementation Pumping frequency: Q3 hrs  Interventions Interventions: DEBP;Hand express  Discharge    Consult Status Consult Status: Follow-up Date: 10/29/20 Follow-up type: In-patient    Tisha Cline, Diamond Nickel 10/29/2020, 1:02 AM

## 2020-10-29 NOTE — Progress Notes (Addendum)
POSTPARTUM PROGRESS NOTE POD #1  Subjective: Anna [name] is a 30 y.o. [id number] s/p LTCS at [redacted]w[redacted]d.  She reports she doing well. No acute events overnight. She denies any problems with ambulating, voiding or po intake. Denies nausea or vomiting. She hasn't passed flatus yet. Pain is well controlled on current medication regimen.  Lochia is minimal.  Objective: Blood pressure (!) 91/53, pulse 65, temperature 98.3 F (36.8 C), temperature source Oral, resp. rate 16, height 5\' 9"  (1.753 m), weight 81.2 kg, last menstrual period 02/10/2020, SpO2 100 %, unknown if currently breastfeeding.  Physical Exam:  General: alert, cooperative and no distress. Chest: no respiratory distress Abdomen: soft, non-tender  Uterine Fundus: firm and at level of umbilicus Extremities: no calf swelling or tenderness  No calf/ankle edema. Dressing: honeycomb dressing intact.  Recent Labs    10/28/20 0730 10/29/20 0614  HGB 11.4* 9.3*  HCT 35.5* 27.4*    Assessment/Plan: Anna [name] is a 30 y.o. 38 s/p LTCS at [redacted]w[redacted]d due to IUGR of fetus 2.   Routine Postpartum Care: Doing well, pain well-controlled.  -- CBC: Hemoglobin trended down from 11.4 g/dL to 9.3 g/dL. Patient was initiated on PO iron. -- Continue routine care, lactation support.  -- Contraception: vasectomy with POP bridge. -- Feeding: Breast. Bottle feeding as a supplement.  Dispo: Plan for discharge POD #2-3.  [redacted]w[redacted]d 10/29/2020 7:16 AM

## 2020-10-29 NOTE — Lactation Note (Signed)
This note was copied from a baby's chart. Lactation Consultation Note Adding baby to f/u Patient Name: Anna [name] JOITG'P Date: 10/29/2020 Reason for consult: Follow-up assessment;Early term 37-38.6wks;Multiple gestation Age:32 hours  Maternal Data    Feeding Mother's Current Feeding Choice: Breast Milk and Donor Milk Nipple Type: Extra Slow Flow  LATCH Score                    Lactation Tools Discussed/Used    Interventions    Discharge    Consult Status Consult Status: Follow-up Date: 10/29/20 Follow-up type: In-patient    Charyl Dancer 10/29/2020, 4:14 AM

## 2020-10-29 NOTE — Lactation Note (Signed)
This note was copied from a baby's chart. Lactation Consultation Note  Patient Name: Stpehanie Montroy HENID'P Date: 10/29/2020 Reason for consult: Follow-up assessment;Early term 37-38.6wks Age:32 hours  Maternal Data Has patient been taught Hand Expression?: Yes Does the patient have breastfeeding experience prior to this delivery?: Yes How long did the patient breastfeed?: 6 months with her first two children and 11 months with her third child  Feeding Mother's Current Feeding Choice: Breast Milk and Donor Milk Nipple Type: Extra Slow Flow  LATCH Score                    Lactation Tools Discussed/Used Tools: Pump Breast pump type: Double-Electric Breast Pump;Manual Pumping frequency: Every three hours  Interventions Interventions: Education  Discharge Pump: DEBP;Manual;Personal WIC Program: No  Consult Status Consult Status: Follow-up Date: 10/30/20 Follow-up type: In-patient    Dora Sims 10/29/2020, 6:13 PM

## 2020-10-29 NOTE — Lactation Note (Signed)
This note was copied from a baby's chart. Lactation Consultation Note  Patient Name: Anna [name] HXTAV'W Date: 10/29/2020 Reason for consult: Follow-up assessment;Early term 57-38.6wks Age:32 hours   P5 mother whose infant twin boys are now 62 hours old.  These are early twin babies born at 37+2 weeks.  Mother's feeding preference is breast/donor breast milk.  Mother breast fed her other three children (now 75, 78 and 108 years old).  The first two were breast fed for 6 months and the third child for 11 months.  Baby "A" weighs 6 lbs and Baby "B" < 6 lbs.  Mother has been breast feeding followed by supplementation with donor breast milk every three hours.  Mother reported babies are feeding well and she is having no difficulty with the supplementation.  Mother has uterine contractions with feedings and no pain with latching.  Suggested mother increase supplementation volumes to 20-30 tonight as tolerated.  Mother verbalized understanding.  Encouraged mother to continue her excellent feeding plan into the evening and night.  She will call for assistance as needed.  LATCH scores have been good and the boys are voiding/stooling; no concerns from mother at this time.  Mother has a DEBP for home use.  Family member present.    Maternal Data Has patient been taught Hand Expression?: Yes Does the patient have breastfeeding experience prior to this delivery?: Yes How long did the patient breastfeed?: 6 months with first two children and 11 months with third child  Feeding Mother's Current Feeding Choice: Breast Milk and Donor Milk Nipple Type: Extra Slow Flow  LATCH Score Latch: Grasps breast easily, tongue down, lips flanged, rhythmical sucking.  Audible Swallowing: A few with stimulation  Type of Nipple: Everted at rest and after stimulation  Comfort (Breast/Nipple): Soft / non-tender  Hold (Positioning): Assistance needed to correctly position infant at breast and maintain  latch.  LATCH Score: 8   Lactation Tools Discussed/Used Tools: Pump;Flanges Flange Size: 24 Breast pump type: Double-Electric Breast Pump;Manual Reason for Pumping: Breast stimulation Pumping frequency: Every three hours  Interventions Interventions: Education  Discharge Pump: DEBP;Manual;Personal WIC Program: No  Consult Status Consult Status: Follow-up Date: 10/30/20 Follow-up type: In-patient    Anna [name] R Anna [name] 10/29/2020, 6:06 PM

## 2020-10-30 LAB — SURGICAL PATHOLOGY

## 2020-10-30 NOTE — Progress Notes (Signed)
Patient ID: Anna [name], female   DOB: [birth year], 30 y.o.   MRN: [id number] POSTPARTUM PROGRESS NOTE  Post Operative Day 2  Subjective:  Anna [name] is a 30 y.o. [id number] s/p rLTCS of di-di twins at [redacted]w[redacted]d due to malpresentation of Twin A in context of IUGR of Twin B.  No acute events overnight.  Pt denies problems with ambulating, voiding or po intake.  She denies nausea or vomiting.  Pain is poorly controlled. She states that the pain is 9/10 and it has been causing difficulty ambulating. She has not had flatus. She has not had bowel movement.  Lochia is minimal she describes it as "light spotting".  Objective: Blood pressure 106/75, pulse 61, temperature 97.6 F (36.4 C), temperature source Oral, resp. rate 17, height 5\' 9"  (1.753 m), weight 81.2 kg, last menstrual period 02/10/2020, SpO2 100 %, unknown if currently breastfeeding.  Physical Exam:  General: alert, cooperative and no distress Chest: no respiratory distress Heart:regular rate, distal pulses intact Abdomen: soft, nontender,  Uterine Fundus: firm, appropriately tender DVT Evaluation: No calf swelling or tenderness Extremities: no LE edema Skin: warm, dry; incision is intact with some weeping of blood and pus.  Recent Labs    10/28/20 0730 10/29/20 0614  HGB 11.4* 9.3*  HCT 35.5* 27.4*    Assessment/Plan: Anna [name] is a 31 y.o. [phone] s/p rLTCS of di-di twins at [redacted]w[redacted]d due to malpresentation of Twin A in context of IUGR of Twin B.  POD#2 - Continues to have 9/10 pain but otherwise doing well. Contraception: Vasectomy in partner with POP bridge Feeding: Breast Dispo: Plan for discharge POD#2-3.   LOS: 2 days   Anna [name] B, Medical Student, 10/30/2020, 7:05 AM

## 2020-10-30 NOTE — Lactation Note (Signed)
This note was copied from a baby's chart. Lactation Consultation Note  Patient Name: Apryll Hinkle GGEZM'O Date: 10/30/2020 Reason for consult: Follow-up assessment;Mother's request;Difficult latch;Early term 37-38.6wks;Infant < 6lbs;Multiple gestation;Infant weight loss Age:32 hours   Infant B not able to take as much volume after latching. Mom to alternate breast feeding every other feeding to increase volume.   Infant some emesis with yellow slow flow nipple and was switched back to purple extra slow flow. LC reviewed paced bottle feeding, providing head support and offering half feeding burping then other half. Mom also sit infant up after feeding 10-15 minutes.   LC reviewed LPTI guidelines to reduce calorie loss including keeping hat on all times and keeping total feeding under 30 minutes.    Plan 1. To feed based on cues 8-12x in 24 hr period no more than 3 hrs without an attempt. Mom to alternate feedings breast and bottle one feeding, all bottle next feeding          2. Supplement with EBM first then DBP according to LPTI guidelines an hrs of age since birth with paced bottle feeding           3. Mom to pump with dEBP q 3 hrs for .   All questions answered at the end of the visit.    Maternal Data    Feeding Mother's Current Feeding Choice: Breast Milk and Donor Milk Nipple Type: Extra Slow Flow  LATCH Score                    Lactation Tools Discussed/Used    Interventions Interventions: Breast feeding basics reviewed;DEBP;Education;Coconut oil  Discharge    Consult Status Consult Status: Follow-up Date: 10/31/20 Follow-up type: In-patient    Waylyn Tenbrink  Nicholson-Springer 10/30/2020, 6:14 PM

## 2020-10-30 NOTE — Lactation Note (Signed)
This note was copied from a baby's chart. Lactation Consultation Note  Patient Name: Boy Tambi Thole PJASN'K Date: 10/30/2020 Reason for consult: Follow-up assessment Age:30 years Mother reports that she is feeling better today about breastfeeding. She reports that her breast are fuller and her milk is coming in . She reports that she was feeling knots in her breast.  She reports after she breastfed that her breast felt softer.   Mother has been breastfeeding twins for 20-30 mins and then supplementing 30-35 mls after each breastfeeding.  Baby A is at 6% wt loss  Baby B is at 10% wt loss this am.   Suggest that she limit baby B's feedings at the breast to 10 mins and then supplement with her ebm/DBM.Marland Kitchen  Mother was advised to begin post pumping her breast to protect her milk supply. Plan of Care : Breastfeed infant with feeding cues Supplement infant with ebm/DBM according to supplemental guidelines. Pump using a DEBP after each feeding for 15-20 mins.   Discussed tips to prevent future Mastitis. Mother reports history of Mastitis with her 3rd child.   Mother to continue to cue base feed infant and feed at least 8-12 times or more in 24 hours and advised to allow for cluster feeding infant as needed.  Mother to continue to due STS. Mother is aware of available LC services at [facility], BFSG'S, OP Dept, and phone # for questions or concerns about breastfeeding.  Mother receptive to all teaching and plan of care.   Maternal Data    Feeding Mother's Current Feeding Choice: Breast Milk and Donor Milk  LATCH Score                    Lactation Tools Discussed/Used    Interventions    Discharge    Consult Status Consult Status: Follow-up Date: 10/30/20 Follow-up type: In-patient    Stevan Born [facility] 10/30/2020, 3:39 PM

## 2020-10-30 NOTE — Lactation Note (Signed)
This note was copied from a baby's chart. Lactation Consultation Note  Patient Name: Anna [name] OIZTI'W Date: 10/30/2020 Reason for consult: Follow-up assessment;Mother's request;Difficult latch;Early term 37-38.6wks;Infant weight loss Age:32 hours  Infant A latched well at the breast with audible swallows with breast compression. Mom had some areas of hardness around base of the breast that softened with use of moist heat massage and infant nursing.  Mom supplemented with DBM first slow flow then to extra slow flow, infant took 25 ml. Infant taken more from the breast so his supplement volume was less.   Mom's left breast softened following feeding with Baby A.  For the right breast, Mom used breast massage, moist heat and hand expression filling bottle with 6 ml. RN to provided coconut oil for care.  Baby A had 3 urine and 1 large stool.   Plan 1. To feed based on cues 8-12x in 24 hr period no more than 3 hrs without an attempt. LC reviewed LPTI to reduce calorie loss including keeping hat on and total feeding under 30 minutes.           2. Supplement with EBM first then DBP according to LPTI guidelines an hrs of age since birth with paced bottle feeding           3. Mom to pump with dEBP q 3 hrs for .   All questions answered at the end of the visit.    Maternal Data    Feeding Mother's Current Feeding Choice: Breast Milk and Donor Milk Nipple Type: Extra Slow Flow (Infant using slow flow but too fast with bouts of choking and switched back to extra slow flow. LC reviewed paced bottle feeding and how to provided good head support, offer half volume burp and then continue.)  LATCH Score Latch: Repeated attempts needed to sustain latch, nipple held in mouth throughout feeding, stimulation needed to elicit sucking reflex.  Audible Swallowing: Spontaneous and intermittent  Type of Nipple: Everted at rest and after stimulation  Comfort (Breast/Nipple): Soft / non-tender  (Mom had some plugged ducts, moist heat applied and breast massage. Breast softened on the left as infant nursed with compression with signs of milk transfer.)  Hold (Positioning): Assistance needed to correctly position infant at breast and maintain latch.  LATCH Score: 8   Lactation Tools Discussed/Used Tools: Pump;Flanges Flange Size: 27 Breast pump type: Double-Electric Breast Pump Reason for Pumping: increase stimulation Pumping frequency: every 3  hrs for 15 minutes  Interventions    Discharge    Consult Status Consult Status: Follow-up Date: 10/31/20 Follow-up type: In-patient    Lucile Hillmann  Nicholson-Springer 10/30/2020, 6:06 PM

## 2020-10-31 LAB — CULTURE, BETA STREP (GROUP B ONLY): Strep Gp B Culture: NEGATIVE

## 2020-10-31 MED ORDER — POLYETHYLENE GLYCOL 3350 17 G PO PACK: 17.0000 g | PACK | Freq: Every day | ORAL | 0 refills | Status: DC

## 2020-10-31 MED ORDER — ACETAMINOPHEN 500 MG PO TABS: 1000.0000 mg | ORAL_TABLET | Freq: Four times a day (QID) | ORAL | 0 refills | Status: DC

## 2020-10-31 MED ORDER — OXYCODONE HCL 5 MG PO TABS: 5.0000 mg | ORAL_TABLET | ORAL | 0 refills | Status: DC | PRN

## 2020-10-31 MED ORDER — IBUPROFEN 600 MG PO TABS: 600.0000 mg | ORAL_TABLET | Freq: Four times a day (QID) | ORAL | 0 refills | Status: DC | PRN

## 2020-10-31 MED ORDER — COCONUT OIL OIL: 1.0000 "application " | TOPICAL_OIL | 0 refills | Status: DC | PRN

## 2020-10-31 MED ORDER — FERROUS SULFATE 325 (65 FE) MG PO TABS: 325.0000 mg | ORAL_TABLET | ORAL | 3 refills | Status: DC

## 2020-10-31 NOTE — Progress Notes (Signed)
OB Note D/w mom re: circs for the babies and risk of infection, bleeding, additional surgery and it's considered elective as there is some evidence for decreased risk of UTIs, STDs, HIV, penile cancer in other countries. Patient would like to proceed. Babies examined and they both look big enough for 1.1 gomco circ today.  Cornelia Copa MD Attending Center for [employer] (Faculty Practice) 10/31/2020 Time: 0900

## 2020-10-31 NOTE — Lactation Note (Signed)
This note was copied from a baby's chart. Lactation Consultation Note  Patient Name: Anna [name] TWSFK'C Date: 10/31/2020 Reason for consult: Follow-up assessment;Early term 37-38.6wks;Infant weight loss;Multiple gestation Age:32 hours  Visited with mom of 29 hours old ETI twins, she's a P4 and experienced BF. Mom has been pumping and supplementing with both, her EBM/donor milk; she's using her Lansinoh pump from home instead of the hospital grade one, mom voiced her personal pump has better suction. Babies were getting circumcised at the time of [facility] consultation.   Mom and babies are going home today, baby A is at 8% weight loss and baby B at 10%. Reviewed discharge education, pumping schedule, lactogenesis II, size of babies' stomach and supplementation guidelines for LPI's due to birth weight.  Feeding plan:  1. Encouraged mom to continue feeding babies STS 8-12 times/24 hours or sooner if feeding cues are present 2. She'll continue pumping every 3 hours after feedings at the breast 3. She'll continue supplementing babies with + 30 ml EBM/donor milk after feedings at the breast  No support person in mom's room at the time of [facility] consultation. Mom reported all questions and concerns were answered, she's aware of LC OP services and will call PRN.   Maternal Data    Feeding Mother's Current Feeding Choice: Breast Milk and Donor Milk  Lactation Tools Discussed/Used Tools: Pump;Flanges Flange Size: 27 Breast pump type: Double-Electric Breast Pump;Manual;Other (comment) (Mom is using her Lansinoh DEBP from home at the hospital) Pump Education: Setup, frequency, and cleaning;Milk Storage Reason for Pumping: ETI twins Pumping frequency: q 3 hours Pumped volume: 35 mL  Interventions Interventions: Breast feeding basics reviewed;DEBP;Education  Discharge Discharge Education: Engorgement and breast care;Warning signs for feeding baby Pump: DEBP;Manual;Personal (Lansinoh DEBP from  home (using it at the hospital))  Consult Status Consult Status: Complete Date: 10/31/20 Follow-up type: Call as needed    Nance Mccombs [name] 10/31/2020, 12:30 PM

## 2020-11-02 ENCOUNTER — Telehealth: Payer: Self-pay | Admitting: *Deleted

## 2020-11-02 NOTE — Telephone Encounter (Signed)
Transition Care Management Follow-up Telephone Call  Date of discharge and from where: 10/31/2020 - [facility]  How have you been since you were released from the hospital? "Doing okay"  Any questions or concerns? No  Items Reviewed:  Did the pt receive and understand the discharge instructions provided? Yes   Medications obtained and verified? Yes   Other? No  Any new allergies since your discharge? No   Dietary orders reviewed? No  Do you have support at home? Yes    Functional Questionnaire: (I = Independent and D = Dependent) ADLs: I  Bathing/Dressing- I  Meal Prep- I  Eating- I  Maintaining continence- I  Transferring/Ambulation- I  Managing Meds- I  Follow up appointments reviewed:   PCP Hospital f/u appt confirmed? No    Specialist Hospital f/u appt confirmed? Yes  Scheduled to see OBGYN on 11/05/2020 @ 1330 and 12/08/2020 @ 0835.  Are transportation arrangements needed? No   If their condition worsens, is the pt aware to call PCP or go to the Emergency Dept.? Yes  Was the patient provided with contact information for the PCP's office or ED? Yes  Was to pt encouraged to call back with questions or concerns? Yes

## 2020-11-05 ENCOUNTER — Other Ambulatory Visit: Payer: Self-pay | Admitting: Obstetrics and Gynecology

## 2020-11-05 ENCOUNTER — Other Ambulatory Visit: Payer: Self-pay

## 2020-11-05 ENCOUNTER — Ambulatory Visit (INDEPENDENT_AMBULATORY_CARE_PROVIDER_SITE_OTHER): Payer: Medicaid Other

## 2020-11-05 VITALS — BP 128/87 | HR 82 | Ht 69.0 in | Wt 154.3 lb

## 2020-11-05 DIAGNOSIS — Z98891 History of uterine scar from previous surgery: Secondary | ICD-10-CM

## 2020-11-05 MED ORDER — OXYCODONE HCL 5 MG PO TABS: 5.0000 mg | ORAL_TABLET | ORAL | 0 refills | Status: DC | PRN

## 2020-11-05 MED ORDER — IBUPROFEN 600 MG PO TABS: 600.0000 mg | ORAL_TABLET | Freq: Four times a day (QID) | ORAL | 1 refills | Status: AC | PRN

## 2020-11-05 NOTE — Progress Notes (Signed)
I have reviewed and agree with care plan per RN note. Per patient request I have sent in a one time refill of roxicodone and ibuprofen.    Casper Harrison, MD [facility] Family Medicine Fellow, [facility] for [facility], [facility]

## 2020-11-05 NOTE — Progress Notes (Signed)
Pt here today for wound check. Pt had twin Cesarean section on 10/28/20. Denies any drainage or pain. Only having intermittent sharp pain on right side, but is taking Ibuprofen and Oxy and rates as 9 on pain scale when happens. Pt requesting refill of Ibuprofen and Oxy. Advised will let provider know and will listen for pharmacy when refilled. Pt verbalized understanding. Will send chart to Dr Myriam Jacobson to review.   Incision today is clean,dry and intact.   Removed steri strips today and pt tolerated well. Pt states removed honeycomb dressing at home and tolerated well. Pt states had first BM since delivery and was good.    Pt advised to continue to keep incision dry and clean. Showers only. Has PP visit on 12/08/20.  Judeth Cornfield, RN

## 2020-12-08 ENCOUNTER — Other Ambulatory Visit: Payer: Self-pay

## 2020-12-08 ENCOUNTER — Ambulatory Visit (INDEPENDENT_AMBULATORY_CARE_PROVIDER_SITE_OTHER): Payer: Medicaid Other | Admitting: Student

## 2020-12-08 NOTE — Progress Notes (Signed)
Post Partum Visit Note  Anna [name] is a 30 y.o. [phone] female who presents for a postpartum visit. She is 5 weeks postpartum following a primary cesarean section due to Di-Di twins in transverse position.  I have fully reviewed the prenatal and intrapartum course. The delivery was at 37/2 gestational weeks.  Anesthesia: spinal. Postpartum course has been uneventufl. Baby is doing well. Baby is feeding by both breast and bottle - [employer] Good Start. Bleeding no bleeding. Bowel function is normal. Bladder function is normal. Patient is not sexually active. Contraception method is none.She does not want BC right now; wants to let her body "reset" and her husband is planning vasectomy.  Postpartum depression screening: negative.   The pregnancy intention screening data noted above was reviewed. Potential methods of contraception were discussed. The patient elected to proceed with No data recorded.   Edinburgh Postnatal Depression Scale - 12/08/20 0844       Edinburgh Postnatal Depression Scale:  In the Past 7 Days   I have been able to laugh and see the funny side of things. 0    I have looked forward with enjoyment to things. 0    I have blamed myself unnecessarily when things went wrong. 0    I have been anxious or worried for no good reason. 1    I have felt scared or panicky for no good reason. 0    Things have been getting on top of me. 1    I have been so unhappy that I have had difficulty sleeping. 0    I have felt sad or miserable. 0    I have been so unhappy that I have been crying. 0    The thought of harming myself has occurred to me. 0    Edinburgh Postnatal Depression Scale Total 2             Health Maintenance Due  Topic Date Due   COVID-19 Vaccine (1) Never done    The following portions of the patient's history were reviewed and updated as appropriate: allergies, current medications, past family history, past medical history, past social history, past surgical  history, and problem list.  Review of Systems Pertinent items are noted in HPI.  Objective:  BP 119/82   Pulse 87   Ht 5\' 9"  (1.753 m)   Wt 154 lb 14.4 oz (70.3 kg)   BMI 22.87 kg/m    General:  alert, cooperative, and no distress   Breasts:  not indicated  Lungs: clear to auscultation bilaterally  Heart:  regular rate and rhythm, S1, S2 normal, no murmur, click, rub or gallop  Abdomen: soft, non-tender; bowel sounds normal; no masses,  no organomegaly   Wound well approximated incision; no drainage, no tenderness, rednes  GU exam:  not indicated       Assessment:    There are no diagnoses linked to this encounter.  Healthy postpartum exam.   Plan:   Essential components of care per ACOG recommendations:  1.  Mood and well being: Patient with negative depression screening today. Reviewed local resources for support.  - Patient tobacco use? No.   - hx of drug use? No.    2. Infant care and feeding:  -Patient currently breastmilk feeding?  -Social determinants of health (SDOH) reviewed in EPIC. No concernsThe following needs were identified  3. Sexuality, contraception and birth spacing - Patient does not want a pregnancy in the next year.  Desired family size is  5 children.  - Reviewed forms of contraception in tiered fashion. Patient desired no method today.   - Discussed birth spacing of 18 months  4. Sleep and fatigue -Encouraged family/partner/community support of 4 hrs of uninterrupted sleep to help with mood and fatigue  5. Physical Recovery  - Discussed patients delivery and complications. She describes her labor as good. - Patient had a C-section. Patient had a  No   laceration. Perineal healing reviewed. Patient expressed understanding - Patient has urinary incontinence? NO 6.  Health Maintenance - HM due items addressed Yes - Last pap smear  Diagnosis  Date Value Ref Range Status  04/28/2020 (A)  Final   - Atypical squamous cells of undetermined  significance (ASC-US)   Pap smear not done at today's visit; she is due in November 2022 for a pap smear.  -Breast Cancer screening indicated? No.   7. Chronic Disease/Pregnancy Condition follow up: None  - PCP follow up  Marylene Land, CNM [facility]

## 2020-12-08 NOTE — Progress Notes (Signed)
Pt states is wanting to heal first before she gets on [employer], currently is not sexually active.

## 2021-05-03 ENCOUNTER — Emergency Department (HOSPITAL_COMMUNITY)
Admission: EM | Admit: 2021-05-03 | Discharge: 2021-05-03 | Discharge status: Against Medical Advice | Payer: Medicaid Other

## 2021-05-03 NOTE — ED Notes (Signed)
Called multiple times and unable to find for triage

## 2021-05-03 NOTE — ED Notes (Signed)
Unable to locate pt in lobby  

## 2021-05-04 ENCOUNTER — Telehealth: Payer: Self-pay

## 2021-05-04 NOTE — Telephone Encounter (Signed)
Transition Care Management Unsuccessful Follow-up Telephone Call  Date of discharge and from where:  05/03/2021 from [facility]  Attempts:  1st Attempt  Reason for unsuccessful TCM follow-up call:  Left voice message

## 2021-05-05 ENCOUNTER — Ambulatory Visit: Payer: Self-pay | Admitting: *Deleted

## 2021-05-05 NOTE — Telephone Encounter (Signed)
Transition Care Management Follow-up Telephone Call Date of discharge and from where: 05/03/2021 from [facility] How have you been since you were released from the hospital? Pt stated that she is still feeling bad. Pt encouraged to reach out to PCP office for a virtual or in office visit.  Any questions or concerns? No  Items Reviewed: Did the pt receive and understand the discharge instructions provided? Yes  Medications obtained and verified? Yes  Other? No  Any new allergies since your discharge? No  Dietary orders reviewed? No Do you have support at home? Yes   Functional Questionnaire: (I = Independent and D = Dependent) ADLs: I  Bathing/Dressing- I  Meal Prep- I  Eating- I  Maintaining continence- I  Transferring/Ambulation- I  Managing Meds- I   Follow up appointments reviewed:  PCP Hospital f/u appt confirmed? No   Specialist Hospital f/u appt confirmed? No   Are transportation arrangements needed? No  If their condition worsens, is the pt aware to call PCP or go to the Emergency Dept.? Yes Was the patient provided with contact information for the PCP's office or ED? Yes Was to pt encouraged to call back with questions or concerns? Yes

## 2021-05-05 NOTE — Telephone Encounter (Signed)
Transition Care Management Unsuccessful Follow-up Telephone Call  Date of discharge and from where:  12/05/2022from [facility]  Attempts:  2nd Attempt  Reason for unsuccessful TCM follow-up call:  Unable to leave message

## 2021-05-05 NOTE — Telephone Encounter (Signed)
Pt called in c/o left side facial pain and pressure from what she suspects is a sinus infection.  She gets them frequently.   She has yellow mucus she is blowing from her nose.   Her teeth on the left side hurt also due to the sinus pressure which she said happens when she has a sinus infection.    She also has a tooth on the top left side that has an old feeling in it that may be bothering her too along with the sinus pain.   She does not have a dentist.  There are no appts at [facility].   I'm sending a note to see if she can be scheduled with one of the NPs that is doing virtual visits only.   [facility] can not schedule with them.  Pt was agreeable to someone calling her back for a possible appt with one of the NPs for a virtual visit.  Notes sent to [facility].   Her phone number in chart is correct.

## 2021-05-05 NOTE — Telephone Encounter (Signed)
Reason for Disposition  [1] Sinus congestion (pressure, fullness) AND [2] present > 10 days  Answer Assessment - Initial Assessment Questions 1. ONSET: "When did the pain start?" (e.g., minutes, hours, days)     Left side of my face is hurting.  Pain is from side of my head to my neck.   I feel pressure under my left eye.  My left face is slightly swollen.    I having mucus from my nose.    The mucus from my nose is dark yellow. 2. ONSET: "Does the pain come and go, or has it been constant since it started?" (e.g., constant, intermittent, fleeting)     It started a week ago.   I thought was an ear infection but it is spreading into my face and nose.  Then stuffy nose.    Also having pain bottom left side inside of my mouth.   It's the tooth before the wisdom tooth.  That's been hurting for 5 days now.  I don't have a dentist.   I have difficulty chewing on left side.    That tooth has an old feeling in it.    3. SEVERITY: "How bad is the pain?"   (Scale 1-10; mild, moderate or severe)   - MILD (1-3): doesn't interfere with normal activities    - MODERATE (4-7): interferes with normal activities or awakens from sleep    - SEVERE (8-10): excruciating pain, unable to do any normal activities      7-9/10   I'm taking ibuprofen for it.   It helps. 4. LOCATION: "Where does it hurt?"      Left side of face and pressure under left eye.   Runny nose.    No coughing and no fever.   I have body aches.   I just had twins a couple months ago.   I'm still sore from that ordeal.   5. RASH: "Is there any redness, rash, or swelling of the face?"     Slight swelling of left face. 6. FEVER: "Do you have a fever?" If Yes, ask: "What is it, how was it measured, and when did it start?"      No 7. OTHER SYMPTOMS: "Do you have any other symptoms?" (e.g., fever, toothache, nasal discharge, nasal congestion, clicking sensation in jaw joint)     When I have sinus issues my teeth hurt too.   I get sinusitis.    8.  PREGNANCY: "Is there any chance you are pregnant?" "When was your last menstrual period?"     Not now had twins a couple of months ago.  Protocols used: Face Pain-A-AH, Sinus Pain or Congestion-A-AH

## 2021-05-07 NOTE — Telephone Encounter (Signed)
Patient called back- she has not heard from the office- message sent to office- they do not have open appointment. Patient advised UC/virtual today- so she is not waiting any longer to address her symptoms.

## 2021-05-11 DIAGNOSIS — K047 Periapical abscess without sinus: Secondary | ICD-10-CM | POA: Diagnosis not present

## 2021-05-11 DIAGNOSIS — J329 Chronic sinusitis, unspecified: Secondary | ICD-10-CM | POA: Diagnosis not present

## 2022-04-15 IMAGING — US US MFM OB FOLLOW-UP EACH ADDL GEST (MODIFY)
1 series · 15 of 28 positions shown · non-contrast
Comparison: none

[Series 1: us mfm ob follow-up each addl gest (modify) · 116 acquisitions, 15 frames shown]
[im 1/116]
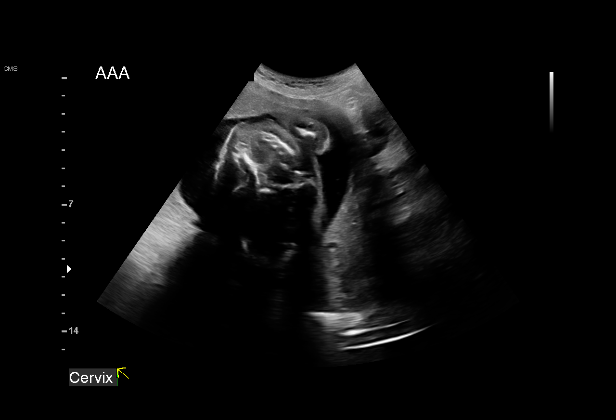
[im 9/116]
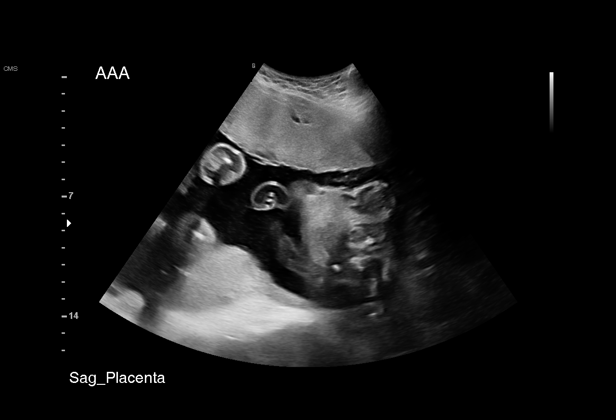
[im 18/116]
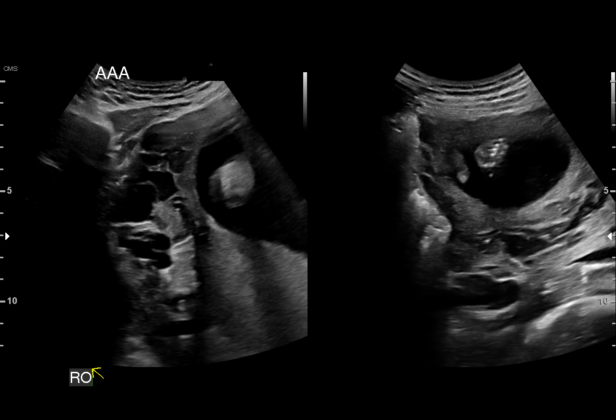
[im 26/116]
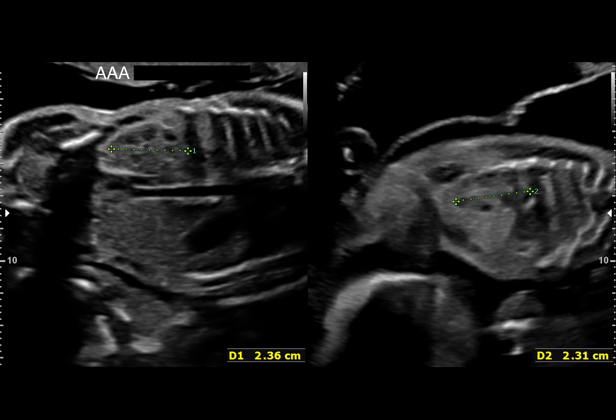
[im 35/116]
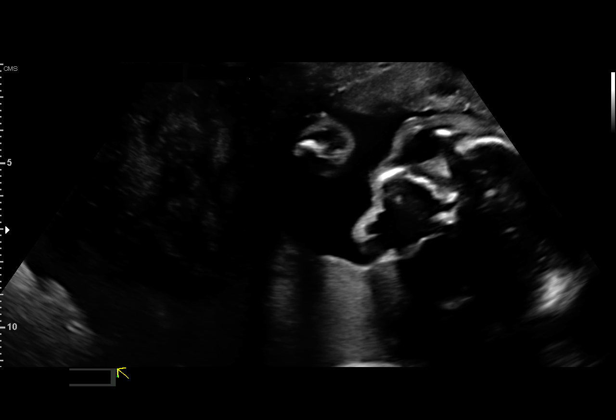
[im 43/116]
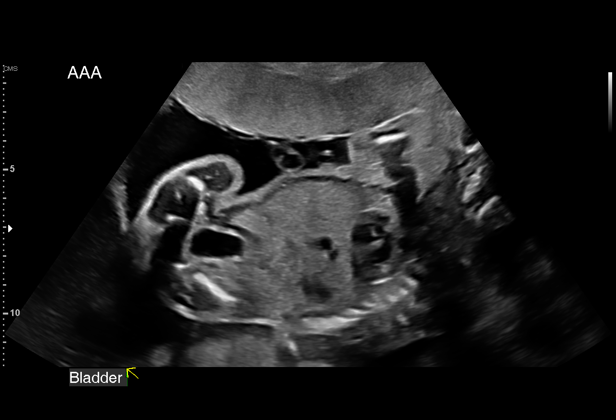
[im 52/116]
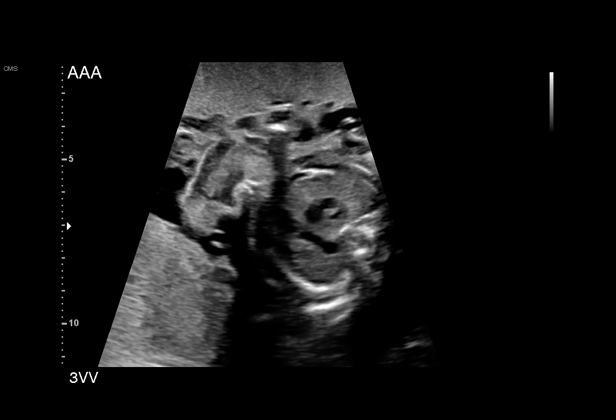
[im 60/116]
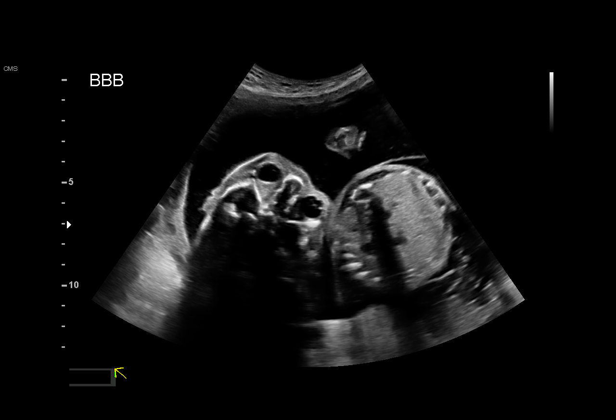
[im 64/116]
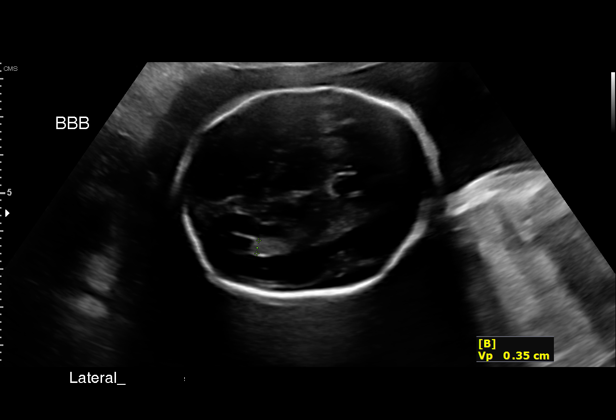
[im 73/116]
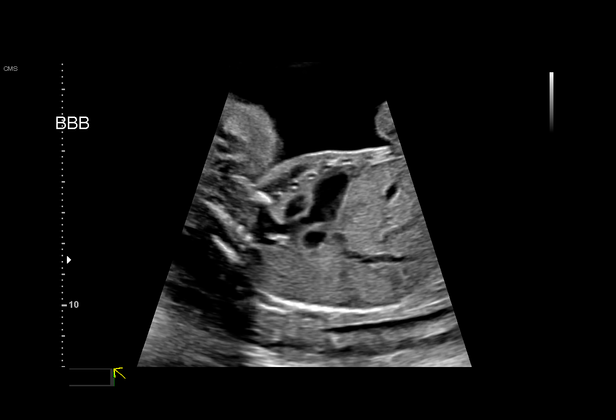
[im 81/116]
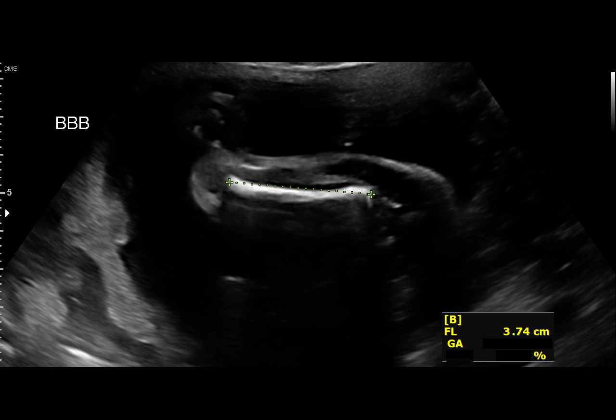
[im 90/116]
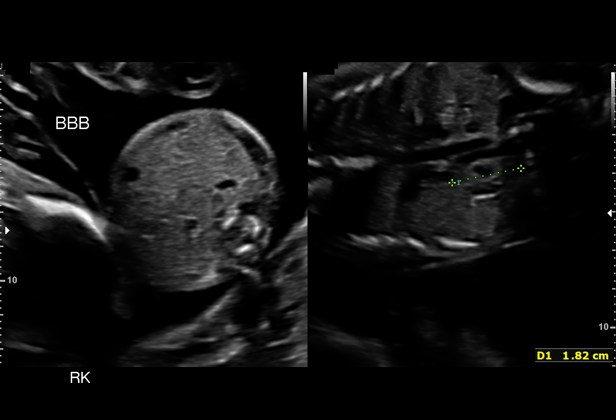
[im 98/116]
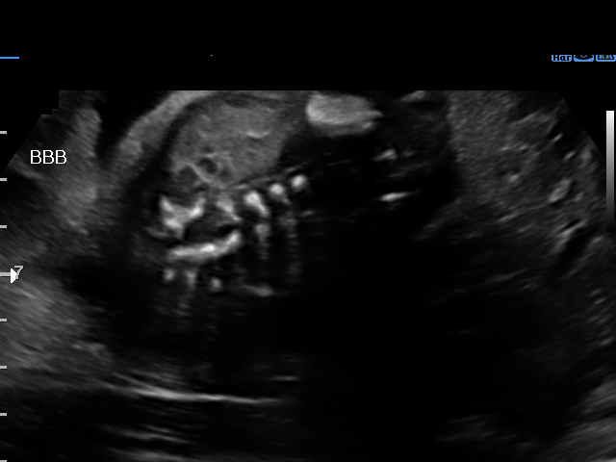
[im 107/116]
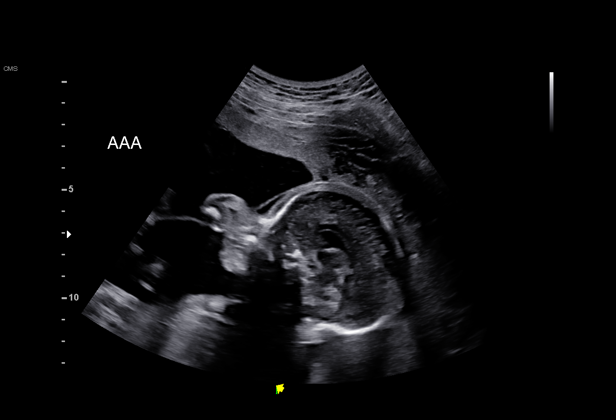
[im 116/116]
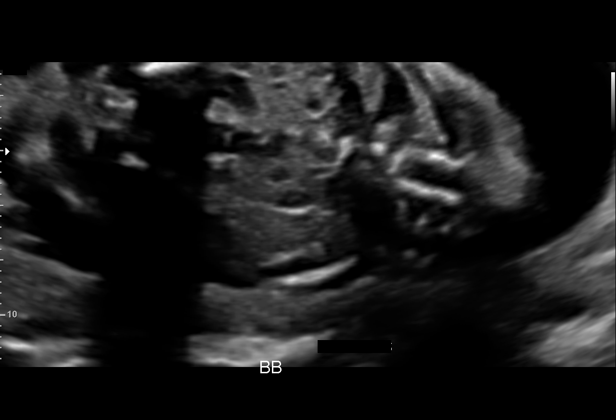

[15 of 28 positions shown; findings below may reference images not displayed]

GEST

Indications

 23 weeks gestation of pregnancy
 Antenatal screening for malformations
 Twin pregnancy, di/di, second trimester
 Genetic carrier (SMA)
 Low risk NIPS
 Echogenic intracardiac focus of the heart
 (EIF)
Fetal Evaluation (Fetus A)

 Num Of Fetuses:         2
 Fetal Heart Rate(bpm):  140
 Cardiac Activity:       Observed
 Fetal Lie:              Lower Fetus
 Presentation:           Cephalic
 Placenta:               Anterior
 P. Cord Insertion:      Visualized
 Membrane Desc:      Dividing Membrane seen - Dichorionic.

 Amniotic Fluid
 AFI FV:      Within normal limits

                             Largest Pocket(cm)

Biometry (Fetus A)

 BPD:      60.8  mm     G. Age:  24w 5d         94  %    CI:        78.54   %    70 - 86
                                                         FL/HC:      18.3   %    19.2 -
 HC:       217   mm     G. Age:  23w 5d         66  %    HC/AC:      1.12        1.05 -
 AC:      193.4  mm     G. Age:  24w 0d         75  %    FL/BPD:     65.5   %    71 - 87
 FL:       39.8  mm     G. Age:  22w 6d         33  %    FL/AC:      20.6   %    20 - 24

 LV:        4.7  mm

 Est. FW:     610  gm      1 lb 6 oz     72  %     FW Discordancy     0 \ 13 %
OB History

 Gravidity:    5         Term:   3
 TOP:          1        Living:  3
Gestational Age (Fetus A)

 LMP:           23w 0d        Date:  02/10/20                 EDD:   11/16/20
 U/S Today:     23w 6d                                        EDD:   11/10/20
 Best:          23w 0d     Det. By:  LMP  (02/10/20)          EDD:   11/16/20
Anatomy (Fetus A)

 Cranium:               Appears normal         LVOT:                   Previously seen
 Cavum:                 Appears normal         Aortic Arch:            Previously seen
 Ventricles:            Appears normal         Ductal Arch:            Previously seen
 Choroid Plexus:        Appears normal         Diaphragm:              Appears normal
 Cerebellum:            Appears normal         Stomach:                Appears normal, left
                                                                       sided
 Posterior Fossa:       Appears normal         Abdomen:                Appears normal
 Nuchal Fold:           Previously seen        Abdominal Wall:         Previously seen
 Face:                  Appears normal         Cord Vessels:           Previously seen
                        (orbits and profile)
 Lips:                  Appears normal         Kidneys:                Appear normal
 Palate:                Previously seen        Bladder:                Appears normal
 Thoracic:              Appears normal         Spine:                  Previously seen
 Heart:                 Appears normal         Upper Extremities:      Previously seen
                        (4CH, axis, and
                        situs)
 RVOT:                  Previously seen        Lower Extremities:      Previously seen

 Other:  Male gender previously seen. Heels and open hands/5th digits
         previously previously visualized. Nasal bone visualized. Lenses
         visualized.

Fetal Evaluation (Fetus B)

 Num Of Fetuses:         2
 Fetal Heart Rate(bpm):  153
 Cardiac Activity:       Observed
 Fetal Lie:              Upper Fetus
 Presentation:           Transverse, head to maternal right
 Placenta:               Anterior
 P. Cord Insertion:      Previously Visualized
 Membrane Desc:      Dividing Membrane seen - Dichorionic.
 Amniotic Fluid
 AFI FV:      Within normal limits

                             Largest Pocket(cm)

Biometry (Fetus B)

 BPD:      53.9  mm     G. Age:  22w 3d         23  %    CI:        73.09   %    70 - 86
                                                         FL/HC:      19.0   %    19.2 -
 HC:      200.4  mm     G. Age:  22w 1d         10  %    HC/AC:      1.08        1.05 -
 AC:      185.4  mm     G. Age:  23w 2d         53  %    FL/BPD:     70.7   %    71 - 87
 FL:       38.1  mm     G. Age:  22w 1d         16  %    FL/AC:      20.6   %    20 - 24
 HUM:      38.5  mm     G. Age:  23w 5d         56  %

 LV:        3.5  mm

 Est. FW:     531  gm      1 lb 3 oz     31  %     FW Discordancy        13  %
Gestational Age (Fetus B)

 LMP:           23w 0d        Date:  02/10/20                 EDD:   11/16/20
 U/S Today:     22w 4d                                        EDD:   11/19/20
 Best:          23w 0d     Det. By:  LMP  (02/10/20)          EDD:   11/16/20
Anatomy (Fetus B)

 Cranium:               Appears normal         LVOT:                   Appears normal
 Cavum:                 Appears normal         Aortic Arch:            Previously seen
 Ventricles:            Appears normal         Ductal Arch:            Previously seen
 Choroid Plexus:        Appears normal         Diaphragm:              Appears normal
 Cerebellum:            Previously seen        Stomach:                Appears normal, left
                                                                       sided
 Posterior Fossa:       Previously seen        Abdomen:                Appears normal
 Nuchal Fold:           Previously seen        Abdominal Wall:         Previously seen
 Face:                  Appears normal         Cord Vessels:           Previously seen
                        (orbits and profile)
 Lips:                  Appears normal         Kidneys:                See comments
 Palate:                Previously seen        Bladder:                Appears normal
 Thoracic:              Appears normal         Spine:                  Previously seen
 Heart:                 Appears normal; EIF    Upper Extremities:      Previously seen
 RVOT:                  Previously seen        Lower Extremities:      Previously seen

 Other:  Male gender previously seen. Nasal bone visualized. Lenses
         visualized.
Cervix Uterus Adnexa

 Cervix
 Length:           3.59  cm.
 Normal appearance by transabdominal scan.

 Uterus
 No abnormality visualized.

 Right Ovary
 Within normal limits.
 Left Ovary
 Within normal limits.

 Cul De Sac
 No free fluid seen.

 Adnexa
 No abnormality visualized.
Impression

 Dichorionic-diamniotic twin pregnancy.
 On previous ultrasound, right kidney could not be seen in twin
 B.  An intracardiac echogenic focus was seen in twin B.  On
 cell free fetal DNA screening, the risks of fetal aneuploidies
 are not increased.

 Twin A: Lower fetus, cephalic presentation, anterior placenta,
 male fetus.  Fetal growth is appropriate for gestational age.
 Amniotic fluid is normal and good fetal activity seen.

 Twin B: Upper fetus, transverse lie and head to maternal
 right, anterior placenta, male fetus.  Fetal growth is
 appropriate for gestational age.  Amniotic fluid is normal and
 good fetal activity seen.
 Right kidney seen.  However, on transverse view the renal
 pelvis appear closer and I cannot rule out the possibility of
 horseshoe kidney.

 I explained the findings and also informed her that it could be
 a normal right kidney as ultrasound has limitations in
 accurately assessing fetal anatomy.  Given that she had low
 risk for fetal aneuploidies on cell free fetal DNA screening,
 isolated horseshoe kidney (if confirmed in later gestation or
 after delivery) is unlikely to be associated with fetal
 chromosomal anomalies and is likely to have good prognosis.
Recommendations

 -An appointment was made for her to return in 4 weeks for
 fetal growth assessment.

                 Maude, Enork

## 2022-07-15 IMAGING — US US MFM OB LIMITED
1 series · 14 of 28 positions shown · non-contrast
Comparison: none

[Series 1: us mfm ob limited · 14 of 32 slices shown]
[im 2/32]
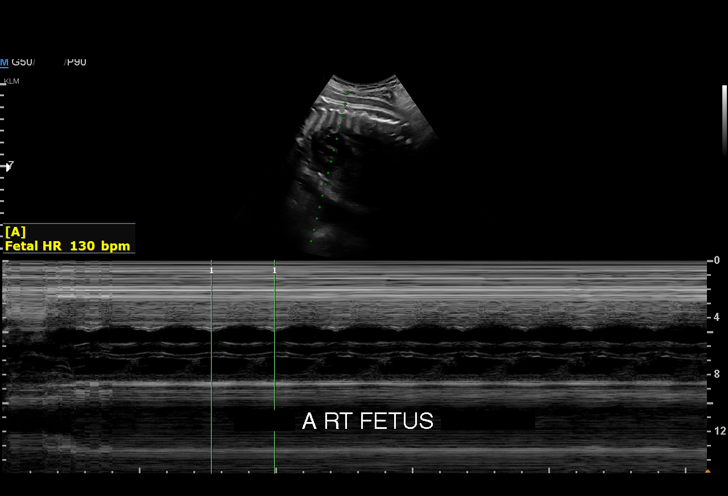
[im 4/32]
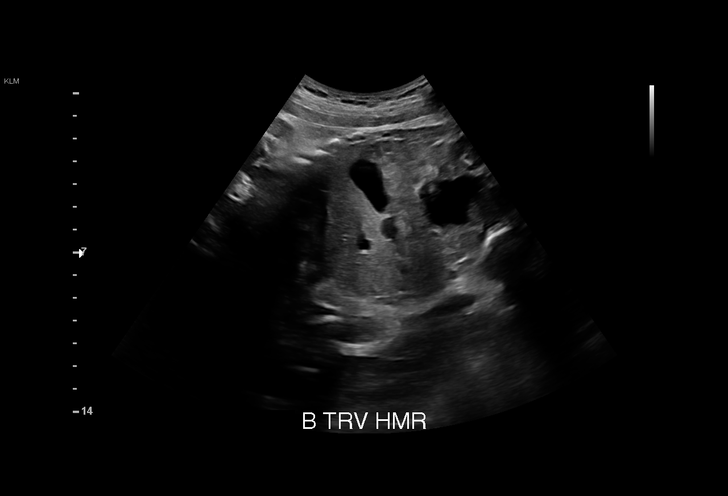
[im 6/32]
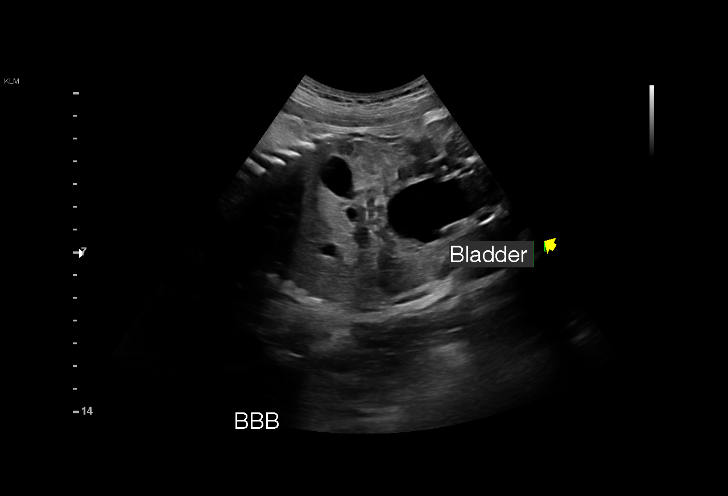
[im 9/32]
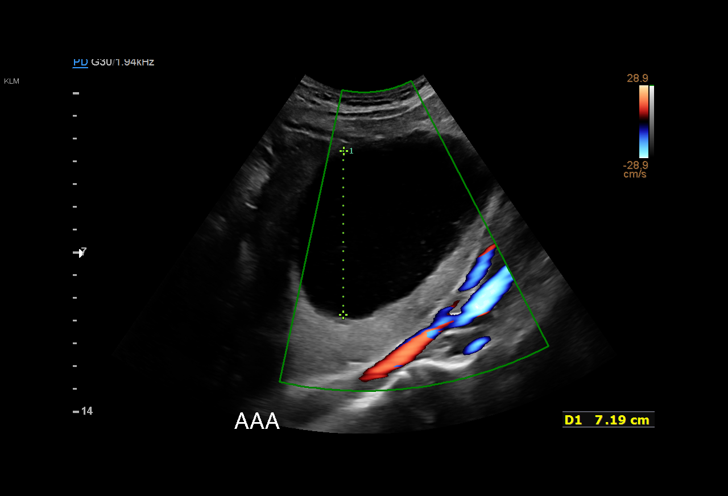
[im 11/32]
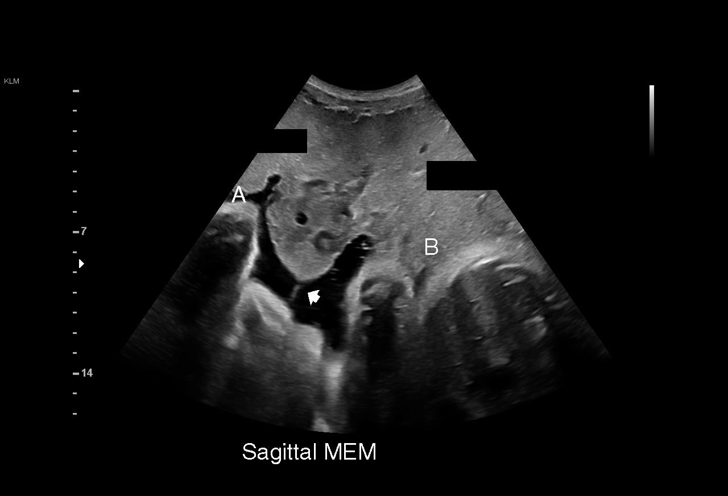
[im 13/32]
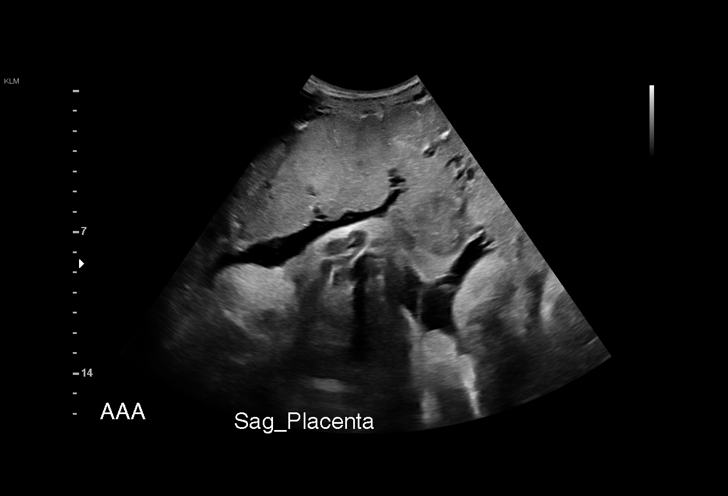
[im 15/32]
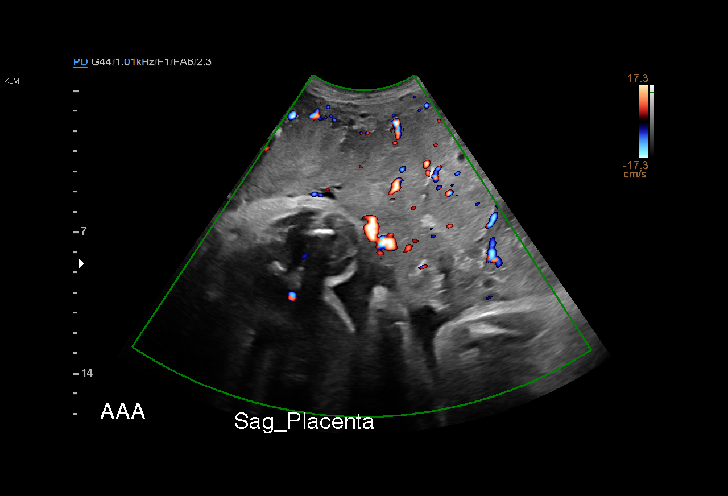
[im 18/32]
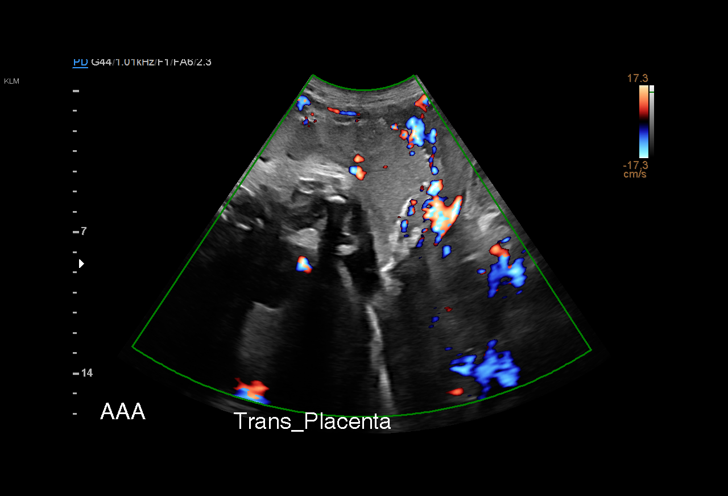
[im 20/32]
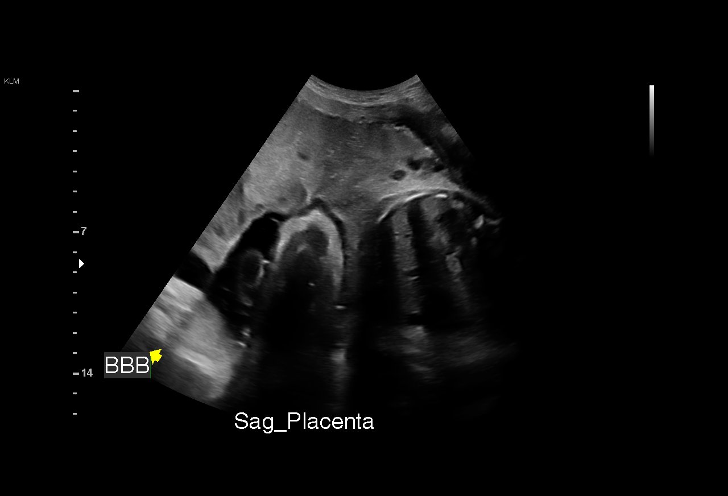
[im 22/32]
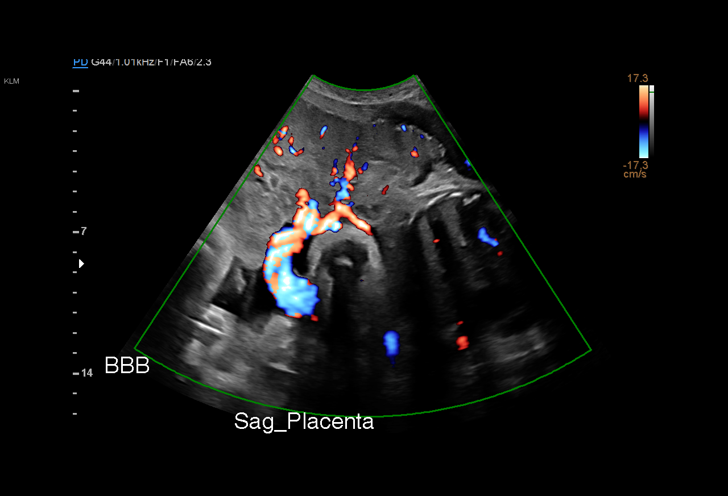
[im 25/32]
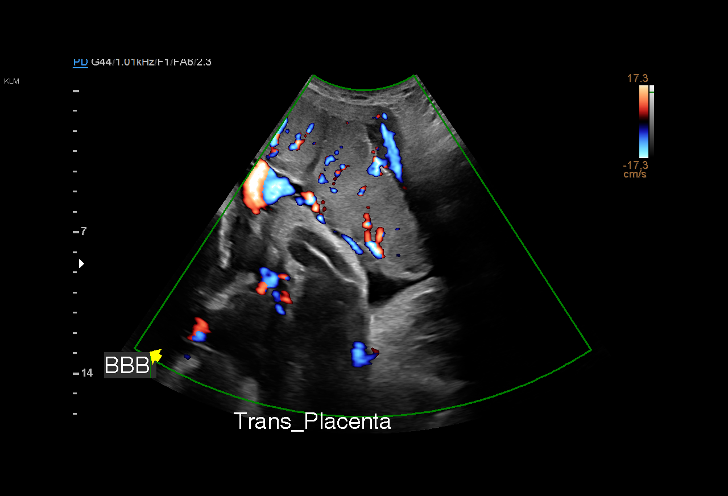
[im 27/32]
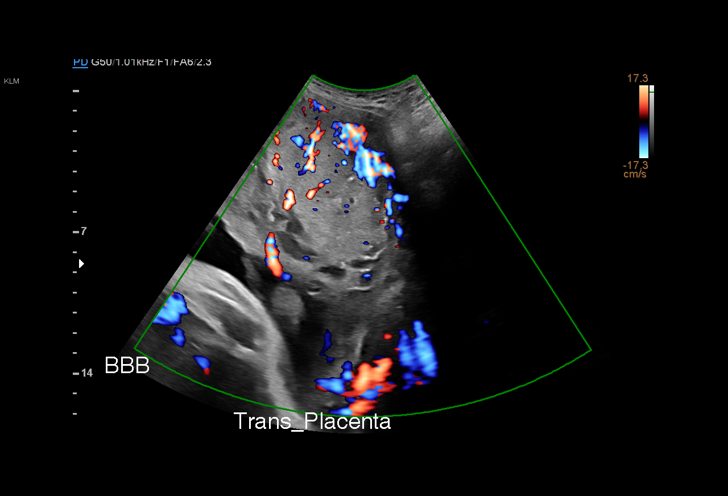
[im 29/32]
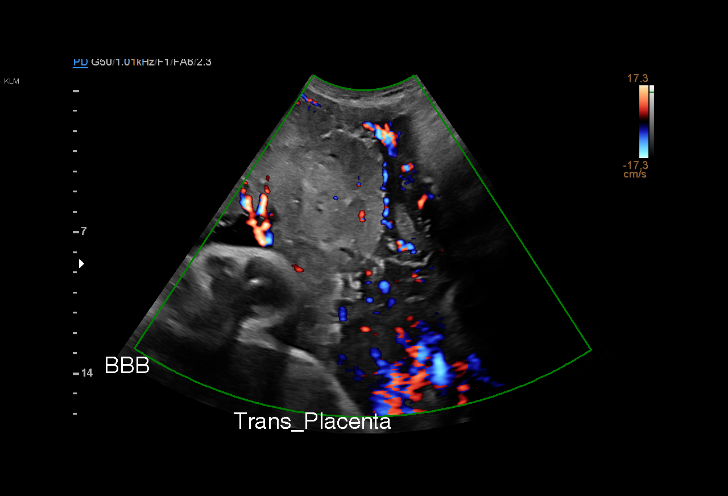
[im 32/32]
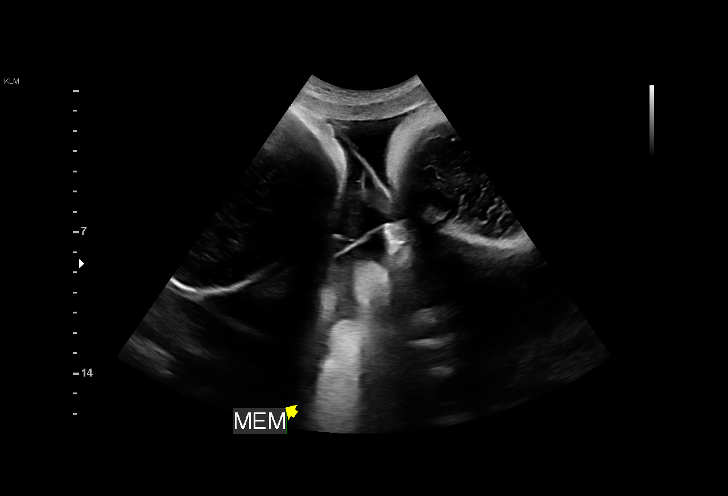

[14 of 28 positions shown; findings below may reference images not displayed]

[REDACTED]
                   [facility] CNM                             [HOSPITAL]

Indications

 Vaginal bleeding in pregnancy, third trimester
 Determine Fetal presentation by ultrasound
 36 weeks gestation of pregnancy
 Twin pregnancy, di/di, third trimester
Fetal Evaluation (Fetus A)

 Num Of Fetuses:         2
 Fetal Heart Rate(bpm):  130
 Cardiac Activity:       Observed
 Fetal Lie:              Maternal right side
 Presentation:           Breech
 Placenta:               Anterior
 P. Cord Insertion:      Previously Visualized
 Membrane Desc:      Dividing Membrane seen - Dichorionic.

 Amniotic Fluid
 AFI FV:      Within normal limits

                             Largest Pocket(cm)


 Comment:    No placental abruption or previa identified.
OB History

 Gravidity:    5         Term:   3
 TOP:          1        Living:  3
Gestational Age (Fetus A)
 LMP:           36w 0d        Date:  02/10/20                 EDD:   11/16/20
 Best:          36w 0d     Det. By:  LMP  (02/10/20)          EDD:   11/16/20
Anatomy (Fetus A)

 Stomach:               Appears normal, left   Bladder:                Appears normal
                        sided

Fetal Evaluation (Fetus B)

 Num Of Fetuses:         2
 Fetal Heart Rate(bpm):  167
 Cardiac Activity:       Observed
 Fetal Lie:              Upper Left Fetus
 Presentation:           Transverse, head to maternal right
 Placenta:               Anterior
 P. Cord Insertion:      Previously Visualized
 Membrane Desc:      Dividing Membrane seen - Dichorionic.

 Amniotic Fluid
 AFI FV:      Within normal limits

                             Largest Pocket(cm)


 Comment:    No placental abruption or previa identified.
Gestational Age (Fetus B)

 LMP:           36w 0d        Date:  02/10/20                 EDD:   11/16/20
 Best:          36w 0d     Det. By:  LMP  (02/10/20)          EDD:   11/16/20
Anatomy (Fetus B)

 Stomach:               Appears normal, left   Bladder:                Appears normal
                        sided
Cervix Uterus Adnexa

 Cervix
 Not visualized (advanced GA >97wks)
Comments

 This patient presented to the [facility] due to contractions and
 possible leakage of fluid.

 A limited ultrasound performed today shows that twin A is in
 the breech presentation and twin B is in the transverse
 presentation.

 There was normal amniotic fluid noted around both twin A
 and twin B.

## 2023-01-24 ENCOUNTER — Ambulatory Visit: Payer: Medicaid Other | Admitting: Family Medicine

## 2023-08-08 DIAGNOSIS — J0100 Acute maxillary sinusitis, unspecified: Secondary | ICD-10-CM | POA: Diagnosis not present

## 2023-08-08 DIAGNOSIS — J0110 Acute frontal sinusitis, unspecified: Secondary | ICD-10-CM | POA: Diagnosis not present

## 2023-11-22 ENCOUNTER — Ambulatory Visit: Admitting: Family Medicine

## 2023-11-22 ENCOUNTER — Encounter: Payer: Self-pay | Admitting: Family Medicine

## 2023-11-22 ENCOUNTER — Other Ambulatory Visit (HOSPITAL_COMMUNITY)
Admission: RE | Admit: 2023-11-22 | Discharge: 2023-11-22 | Disposition: A | Discharge status: Home / Self Care | Source: Ambulatory Visit | Attending: Family Medicine | Admitting: Family Medicine

## 2023-11-22 VITALS — BP 128/88 | HR 88 | Temp 98.6°F | Ht 69.0 in | Wt 136.0 lb

## 2023-11-22 DIAGNOSIS — Z0000 Encounter for general adult medical examination without abnormal findings: Secondary | ICD-10-CM | POA: Diagnosis not present

## 2023-11-22 DIAGNOSIS — Z7689 Persons encountering health services in other specified circumstances: Secondary | ICD-10-CM

## 2023-11-22 DIAGNOSIS — Z711 Person with feared health complaint in whom no diagnosis is made: Secondary | ICD-10-CM | POA: Diagnosis not present

## 2023-11-22 LAB — COMPREHENSIVE METABOLIC PANEL WITH GFR
ALT: 17 U/L (ref 0–35)
AST: 27 U/L (ref 0–37)
Albumin: 4.8 g/dL (ref 3.5–5.2)
Alkaline Phosphatase: 82 U/L (ref 39–117)
BUN: 4 mg/dL — ABNORMAL LOW (ref 6–23)
CO2: 25 meq/L (ref 19–32)
Calcium: 10 mg/dL (ref 8.4–10.5)
Chloride: 103 meq/L (ref 96–112)
Creatinine, Ser: 0.87 mg/dL (ref 0.40–1.20)
GFR: 86.58 mL/min
Glucose, Bld: 79 mg/dL (ref 70–99)
Potassium: 3.6 meq/L (ref 3.5–5.1)
Sodium: 140 meq/L (ref 135–145)
Total Bilirubin: 0.8 mg/dL (ref 0.2–1.2)
Total Protein: 8 g/dL (ref 6.0–8.3)

## 2023-11-22 LAB — CBC WITH DIFFERENTIAL/PLATELET
Basophils Absolute: 0 K/uL (ref 0.0–0.1)
Basophils Relative: 0.5 % (ref 0.0–3.0)
Eosinophils Absolute: 0.1 K/uL (ref 0.0–0.7)
Eosinophils Relative: 1.1 % (ref 0.0–5.0)
HCT: 43.6 % (ref 36.0–46.0)
Hemoglobin: 14.4 g/dL (ref 12.0–15.0)
Lymphocytes Relative: 20 % (ref 12.0–46.0)
Lymphs Abs: 1.6 K/uL (ref 0.7–4.0)
MCHC: 33.1 g/dL (ref 30.0–36.0)
MCV: 94.4 fl (ref 78.0–100.0)
Monocytes Absolute: 0.4 K/uL (ref 0.1–1.0)
Monocytes Relative: 4.7 % (ref 3.0–12.0)
Neutro Abs: 6 K/uL (ref 1.4–7.7)
Neutrophils Relative %: 73.7 % (ref 43.0–77.0)
Platelets: 188 K/uL (ref 150.0–400.0)
RBC: 4.62 Mil/uL (ref 3.87–5.11)
RDW: 13.1 % (ref 11.5–15.5)
WBC: 8.2 K/uL (ref 4.0–10.5)

## 2023-11-22 LAB — TSH: TSH: 1.98 u[IU]/mL (ref 0.35–5.50)

## 2023-11-22 NOTE — Patient Instructions (Signed)
-  It was nice to meet you and look forward to taking care of you.  -Physical exam completed with no concerns.  -Recommend yearly ophthalmology exams and regular dental care. -Continue with healthy diet and participating in regular exercise.  -Ordered labs for wellness exam and STD testing. Office will call with results and will be available on line. -Follow up in 1 year for a physical.

## 2023-11-22 NOTE — Progress Notes (Signed)
 New Patient Office Visit  Subjective   Patient ID: Anna [name], female    DOB: 12/14/1990  Age: 33 y.o. MRN: [id number]  CC:  Chief Complaint  Patient presents with   Establish Care    HPI Anna [name] presents to establish care with new provider.   Patients previous primary care provider was [facility] with Haze Servant. Last seen in 2020.  Specialist: Center for [facility] with Dr. Kathryn Kooistra, CNM   No concerns or complaints.   Diet: General Diet, tries to stay away from dairy.  Exercise: Dance-every 3 days, weight training at least 2 times a week for 30 minutes  Eye exam: Not had within the last year  Dental care: Not regular dental care  Outpatient Encounter Medications as of 11/22/2023  Medication Sig   ibuprofen  (ADVIL ) 600 MG tablet Take 1 tablet (600 mg total) by mouth every 6 (six) hours as needed for fever or headache.   [DISCONTINUED] acetaminophen  (TYLENOL ) 500 MG tablet Take 2 tablets (1,000 mg total) by mouth every 6 (six) hours.   [DISCONTINUED] ferrous sulfate  325 (65 FE) MG tablet Take 1 tablet (325 mg total) by mouth every other day.   [DISCONTINUED] Prenatal Vit-Fe Fumarate-FA (MULTIVITAMIN-PRENATAL) 27-0.8 MG TABS tablet Take 1 tablet by mouth in the morning.   No facility-administered encounter medications on file as of 11/22/2023.    Past Medical History:  Diagnosis Date   Chronic headaches    Environmental and seasonal allergies    Insomnia    Medical history non-contributory     Past Surgical History:  Procedure Laterality Date   CESAREAN SECTION MULTI-GESTATIONAL N/A 10/28/2020   Procedure: CESAREAN SECTION MULTI-GESTATIONAL;  Surgeon: Kandis Devaughn Sayres, MD;  Location: [facility] LD ORS;  Service: Obstetrics;  Laterality: N/A;   NO PAST SURGERIES      Family History  Problem Relation Age of Onset   Depression Mother    Depression Father     Social History   Socioeconomic  History   Marital status: Married    Spouse name: Not on file   Number of children: 5   Years of education: Not on file   Highest education level: High school graduate  Occupational History   Not on file  Tobacco Use   Smoking status: Never   Smokeless tobacco: Never  Vaping Use   Vaping status: Former   Start date: 02/06/2018   Substances: Nicotine   Devices: Stig   Substance and Sexual Activity   Alcohol use: No   Drug use: No   Sexual activity: Yes    Birth control/protection: None  Other Topics Concern   Not on file  Social History Narrative   Not on file   Social Drivers of Health   Financial Resource Strain: Low Risk  (11/18/2023)   Overall Financial Resource Strain (CARDIA)    Difficulty of Paying Living Expenses: Not very hard  Food Insecurity: Food Insecurity Present (11/18/2023)   Hunger Vital Sign    Worried About Running Out of Food in the Last Year: Sometimes true    Ran Out of Food in the Last Year: Sometimes true  Transportation Needs: No Transportation Needs (11/18/2023)   PRAPARE - Administrator, Civil Service (Medical): No    Lack of Transportation (Non-Medical): No  Physical Activity: Sufficiently Active (11/18/2023)   Exercise Vital Sign    Days of Exercise per Week: 5 days    Minutes  of Exercise per Session: 70 min  Stress: No Stress Concern Present (11/18/2023)   [employer] of Occupational Health - Occupational Stress Questionnaire    Feeling of Stress: Only a little  Social Connections: Moderately Isolated (11/18/2023)   Social Connection and Isolation Panel    Frequency of Communication with Friends and Family: Once a week    Frequency of Social Gatherings with Friends and Family: Never    Attends Religious Services: More than 4 times per year    Active Member of [employer] or Organizations: No    Attends Banker Meetings: Not on file    Marital Status: Married  Catering manager Violence: Not At Risk (11/22/2023)    Humiliation, Afraid, Rape, and Kick questionnaire    Fear of Current or Ex-Partner: No    Emotionally Abused: No    Physically Abused: No    Sexually Abused: No    ROS See HPI above    Objective  BP 128/88   Pulse 88   Temp 98.6 F (37 C) (Oral)   Ht 5' 9 (1.753 m)   Wt 136 lb (61.7 kg)   LMP 10/30/2023 (Exact Date)   SpO2 99%   Breastfeeding No   BMI 20.08 kg/m   Physical Exam Vitals reviewed.  Constitutional:      General: She is not in acute distress.    Appearance: Normal appearance. She is normal weight. She is not ill-appearing, toxic-appearing or diaphoretic.  HENT:     Head: Normocephalic and atraumatic.     Right Ear: Tympanic membrane, ear canal and external ear normal. There is no impacted cerumen.     Left Ear: Tympanic membrane, ear canal and external ear normal. There is no impacted cerumen.     Nose:     Right Sinus: No maxillary sinus tenderness or frontal sinus tenderness.     Left Sinus: No maxillary sinus tenderness or frontal sinus tenderness.     Mouth/Throat:     Pharynx: Oropharynx is clear. Uvula midline. No pharyngeal swelling, oropharyngeal exudate, posterior oropharyngeal erythema, uvula swelling or postnasal drip.   Eyes:     General:        Right eye: No discharge.        Left eye: No discharge.     Conjunctiva/sclera: Conjunctivae normal.    Cardiovascular:     Rate and Rhythm: Normal rate and regular rhythm.     Pulses:          Dorsalis pedis pulses are 2+ on the right side and 2+ on the left side.     Heart sounds: Normal heart sounds. No murmur heard.    No friction rub. No gallop.  Pulmonary:     Effort: Pulmonary effort is normal. No respiratory distress.     Breath sounds: Normal breath sounds.  Abdominal:     General: Abdomen is flat. There is no distension.     Palpations: Abdomen is soft. There is no mass.     Tenderness: There is no abdominal tenderness. There is no right CVA tenderness or left CVA tenderness.    Musculoskeletal:        General: Normal range of motion.  Lymphadenopathy:     Head:     Right side of head: No submental or submandibular adenopathy.     Left side of head: No submental or submandibular adenopathy.   Skin:    General: Skin is warm and dry.   Neurological:     General: No  focal deficit present.     Mental Status: She is alert and oriented to person, place, and time. Mental status is at baseline.     Motor: No weakness.     Gait: Gait normal.   Psychiatric:        Mood and Affect: Mood normal.        Behavior: Behavior normal.        Thought Content: Thought content normal.        Judgment: Judgment normal.      Assessment & Plan:  Encounter for annual wellness visit -     CBC with Differential/Platelet -     Comprehensive metabolic panel with GFR -     TSH  Concern about STD in female without diagnosis -     HIV Antibody (routine testing w rflx) -     RPR -     Cervicovaginal ancillary only  Encounter to establish care   1.Review health maintenance:  -Covid booster: Declines  -Hep B vaccine: Unknown when  -HPV vaccine: Declines  2.Physical exam completed with no concerns.  3.Recommend yearly ophthalmology exams and regular dental care. 4.Continue with healthy diet and participating in regular exercise.  5.Ordered labs for wellness exam and STD testing. Office will call with results and will be available on line.  Return in about 1 year (around 11/21/2024) for physical.   Maeson Lourenco, NP

## 2023-11-23 ENCOUNTER — Ambulatory Visit: Payer: Self-pay | Admitting: Family Medicine

## 2023-11-23 DIAGNOSIS — B9689 Other specified bacterial agents as the cause of diseases classified elsewhere: Secondary | ICD-10-CM

## 2023-11-23 LAB — CERVICOVAGINAL ANCILLARY ONLY
Bacterial Vaginitis (gardnerella): POSITIVE — AB
Candida Glabrata: NEGATIVE
Candida Vaginitis: NEGATIVE
Chlamydia: NEGATIVE
Comment: NEGATIVE
Comment: NEGATIVE
Comment: NEGATIVE
Comment: NEGATIVE
Comment: NEGATIVE
Comment: NORMAL
Neisseria Gonorrhea: NEGATIVE
Trichomonas: NEGATIVE

## 2023-11-23 LAB — RPR: RPR Ser Ql: NONREACTIVE

## 2023-11-23 LAB — HIV ANTIBODY (ROUTINE TESTING W REFLEX): HIV 1&2 Ab, 4th Generation: NONREACTIVE

## 2023-11-23 MED ORDER — METRONIDAZOLE 500 MG PO TABS: 500.0000 mg | ORAL_TABLET | Freq: Three times a day (TID) | ORAL | 0 refills | Status: AC

## 2024-02-29 ENCOUNTER — Emergency Department (HOSPITAL_COMMUNITY)
Admission: EM | Admit: 2024-02-29 | Discharge: 2024-02-29 | Disposition: A | Discharge status: Home / Self Care | Attending: Emergency Medicine | Admitting: Emergency Medicine

## 2024-02-29 ENCOUNTER — Emergency Department (HOSPITAL_COMMUNITY)

## 2024-02-29 DIAGNOSIS — J069 Acute upper respiratory infection, unspecified: Secondary | ICD-10-CM | POA: Diagnosis not present

## 2024-02-29 DIAGNOSIS — J40 Bronchitis, not specified as acute or chronic: Secondary | ICD-10-CM | POA: Diagnosis not present

## 2024-02-29 DIAGNOSIS — F17290 Nicotine dependence, other tobacco product, uncomplicated: Secondary | ICD-10-CM | POA: Insufficient documentation

## 2024-02-29 DIAGNOSIS — R059 Cough, unspecified: Secondary | ICD-10-CM | POA: Diagnosis present

## 2024-02-29 MED ORDER — PREDNISONE 20 MG PO TABS: 40.0000 mg | ORAL_TABLET | Freq: Every day | ORAL | 0 refills | Status: AC

## 2024-02-29 MED ORDER — AZITHROMYCIN 250 MG PO TABS
500.0000 mg | ORAL_TABLET | Freq: Once | ORAL | Status: AC
  Administered 2024-02-29: 500 mg via ORAL
  Filled 2024-02-29: qty 2

## 2024-02-29 MED ORDER — PREDNISONE 20 MG PO TABS
60.0000 mg | ORAL_TABLET | Freq: Once | ORAL | Status: AC
  Administered 2024-02-29: 60 mg via ORAL
  Filled 2024-02-29: qty 3

## 2024-02-29 MED ORDER — AZITHROMYCIN 250 MG PO TABS: 250.0000 mg | ORAL_TABLET | Freq: Every day | ORAL | 0 refills | Status: AC

## 2024-02-29 NOTE — Discharge Instructions (Signed)
 Take next dose of antibiotic and steroid tomorrow afternoon.  Take as prescribed.  Overall I suspect you have bronchitis/viral process.  Please return if symptoms worsen.

## 2024-02-29 NOTE — ED Triage Notes (Signed)
 Pt c/o URI symptoms including maxillary sinus pain, congestion, cough, sore throat, fatigue for the last week.

## 2024-02-29 NOTE — ED Provider Notes (Signed)
  EMERGENCY DEPARTMENT AT [facility] Provider Note   CSN: [id number] Arrival date & time: 02/29/24  2114     Patient presents with: URI   Anna [name] is a 33 y.o. female.   Overall patient here with URI symptoms congestion cough.  Sore throat as well.  Nothing makes it worse or better.  She does smoke.  She has no concern for being pregnant.  She has history of headaches.  Denies any nausea vomit diarrhea.  Denies any ongoing fever.  Denies any chest pain shortness of breath weakness numbness tingling.  The history is provided by the patient.       Prior to Admission medications   Medication Sig Start Date End Date Taking? Authorizing Provider  azithromycin  (ZITHROMAX ) 250 MG tablet Take 1 tablet (250 mg total) by mouth daily for 4 days. Take first 2 tablets together, then 1 every day until finished. 02/29/24 03/04/24 Yes Nayeli Calvert, DO  predniSONE (DELTASONE) 20 MG tablet Take 2 tablets (40 mg total) by mouth daily for 4 days. 02/29/24 03/04/24 Yes Zaida Reiland, DO  ibuprofen  (ADVIL ) 600 MG tablet Take 1 tablet (600 mg total) by mouth every 6 (six) hours as needed for fever or headache. 11/05/20   Vertell Recardo HERO, MD    Allergies: Ciprofloxacin hcl    Review of Systems  Updated Vital Signs BP 125/84 (BP Location: Right Arm)   Pulse 70   Temp 98.5 F (36.9 C)   Resp 19   SpO2 100%   Physical Exam Vitals and nursing note reviewed.  Constitutional:      General: She is not in acute distress.    Appearance: She is well-developed. She is not ill-appearing.  HENT:     Head: Normocephalic and atraumatic.     Nose: Congestion present.     Mouth/Throat:     Mouth: Mucous membranes are moist.     Pharynx: Posterior oropharyngeal erythema present. No oropharyngeal exudate.  Eyes:     Extraocular Movements: Extraocular movements intact.     Conjunctiva/sclera: Conjunctivae normal.     Pupils: Pupils are equal, round, and reactive to light.   Cardiovascular:     Rate and Rhythm: Normal rate and regular rhythm.     Pulses: Normal pulses.     Heart sounds: Normal heart sounds. No murmur heard. Pulmonary:     Effort: Pulmonary effort is normal. No respiratory distress.     Breath sounds: Normal breath sounds.  Abdominal:     General: Abdomen is flat.     Palpations: Abdomen is soft.     Tenderness: There is no abdominal tenderness.  Musculoskeletal:        General: No swelling. Normal range of motion.     Cervical back: Normal range of motion and neck supple.  Skin:    General: Skin is warm and dry.     Capillary Refill: Capillary refill takes less than 2 seconds.  Neurological:     General: No focal deficit present.     Mental Status: She is alert.  Psychiatric:        Mood and Affect: Mood normal.     (all labs ordered are listed, but only abnormal results are displayed) Labs Reviewed  RESP PANEL BY RT-PCR (RSV, FLU A&B, COVID)  [id number]  GROUP A STREP BY PCR    EKG: None  Radiology: DG Chest Portable 1 View Result Date: 02/29/2024 CLINICAL DATA:  Cough EXAM: PORTABLE CHEST 1 VIEW COMPARISON:  None  Available. FINDINGS: The heart size and mediastinal contours are within normal limits. Both lungs are clear. The visualized skeletal structures are unremarkable. IMPRESSION: No active disease. Electronically Signed   By: Luke Bun M.D.   On: 02/29/2024 22:45     Procedures   Medications Ordered in the ED  azithromycin  (ZITHROMAX ) tablet 500 mg (has no administration in time range)  predniSONE (DELTASONE) tablet 60 mg (has no administration in time range)                                    Medical Decision Making Amount and/or Complexity of Data Reviewed Radiology: ordered.  Risk Prescription drug management.   Anna [name] is here with viral symptoms.  Normal vitals.  No fever.  Very well-appearing.  Differential diagnosis likely bronchitis/less likely strep.  X-ray was obtained that showed no  evidence of pneumonia or pneumothorax.  COVID flu RSV swab obtained.  Strep swab obtained.  Treated empirically with Z-Pak prednisone for supportive care.  Overall I have no concern for any other acute process.  Discharge.  Understands return precautions.  This chart was dictated using voice recognition software.  Despite best efforts to proofread,  errors can occur which can change the documentation meaning.      Final diagnoses:  Upper respiratory tract infection, unspecified type  Bronchitis    ED Discharge Orders          Ordered    azithromycin  (ZITHROMAX ) 250 MG tablet  Daily        02/29/24 2308    predniSONE (DELTASONE) 20 MG tablet  Daily        02/29/24 2308               Ruthe Cornet, DO 02/29/24 2310

## 2024-03-01 LAB — GROUP A STREP BY PCR: Group A Strep by PCR: NOT DETECTED

## 2024-03-01 LAB — RESP PANEL BY RT-PCR (RSV, FLU A&B, COVID)  RVPGX2
Influenza A by PCR: NEGATIVE
Influenza B by PCR: NEGATIVE
Resp Syncytial Virus by PCR: NEGATIVE
SARS Coronavirus 2 by RT PCR: NEGATIVE
